# Patient Record
Sex: Female | Born: 1980 | Race: Black or African American | Hispanic: No | Marital: Married | Smoking: Never smoker
Health system: Southern US, Community
[De-identification: ages and names within clinical notes are randomized; demographics above are authoritative.]

## PROBLEM LIST (undated history)

## (undated) DIAGNOSIS — Z789 Other specified health status: Secondary | ICD-10-CM

## (undated) HISTORY — PX: NO PAST SURGERIES: SHX2092

## (undated) HISTORY — PX: WISDOM TOOTH EXTRACTION: SHX21

---
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
## 2002-07-03 ENCOUNTER — Emergency Department (HOSPITAL_COMMUNITY): Admission: EM | Admit: 2002-07-03 | Discharge: 2002-07-03 | Payer: Self-pay | Admitting: Emergency Medicine

## 2002-08-28 ENCOUNTER — Emergency Department (HOSPITAL_COMMUNITY): Admission: EM | Admit: 2002-08-28 | Discharge: 2002-08-28 | Payer: Self-pay | Admitting: *Deleted

## 2002-08-28 ENCOUNTER — Encounter: Payer: Self-pay | Admitting: *Deleted

## 2002-09-08 ENCOUNTER — Emergency Department (HOSPITAL_COMMUNITY): Admission: EM | Admit: 2002-09-08 | Discharge: 2002-09-08 | Payer: Self-pay | Admitting: *Deleted

## 2002-09-08 ENCOUNTER — Encounter: Payer: Self-pay | Admitting: *Deleted

## 2002-10-03 ENCOUNTER — Encounter: Payer: Self-pay | Admitting: Orthopaedic Surgery

## 2002-10-03 ENCOUNTER — Encounter (HOSPITAL_COMMUNITY): Admission: RE | Admit: 2002-10-03 | Discharge: 2002-11-02 | Payer: Self-pay | Admitting: Orthopaedic Surgery

## 2003-04-10 ENCOUNTER — Emergency Department (HOSPITAL_COMMUNITY): Admission: EM | Admit: 2003-04-10 | Discharge: 2003-04-11 | Payer: Self-pay | Admitting: Emergency Medicine

## 2003-06-01 ENCOUNTER — Emergency Department (HOSPITAL_COMMUNITY): Admission: EM | Admit: 2003-06-01 | Discharge: 2003-06-02 | Payer: Self-pay | Admitting: *Deleted

## 2003-06-01 ENCOUNTER — Emergency Department (HOSPITAL_COMMUNITY): Admission: EM | Admit: 2003-06-01 | Discharge: 2003-06-01 | Payer: Self-pay | Admitting: Emergency Medicine

## 2003-07-23 ENCOUNTER — Emergency Department (HOSPITAL_COMMUNITY): Admission: EM | Admit: 2003-07-23 | Discharge: 2003-07-23 | Payer: Self-pay | Admitting: Emergency Medicine

## 2003-07-23 ENCOUNTER — Encounter: Payer: Self-pay | Admitting: Emergency Medicine

## 2003-07-24 ENCOUNTER — Emergency Department (HOSPITAL_COMMUNITY): Admission: AD | Admit: 2003-07-24 | Discharge: 2003-07-24 | Payer: Self-pay | Admitting: Family Medicine

## 2003-07-24 ENCOUNTER — Encounter: Payer: Self-pay | Admitting: Family Medicine

## 2003-07-26 ENCOUNTER — Encounter: Admission: RE | Admit: 2003-07-26 | Discharge: 2003-08-20 | Payer: Self-pay | Admitting: Family Medicine

## 2003-12-02 ENCOUNTER — Emergency Department (HOSPITAL_COMMUNITY): Admission: AD | Admit: 2003-12-02 | Discharge: 2003-12-02 | Payer: Self-pay | Admitting: Family Medicine

## 2004-03-13 ENCOUNTER — Emergency Department (HOSPITAL_COMMUNITY): Admission: EM | Admit: 2004-03-13 | Discharge: 2004-03-13 | Payer: Self-pay | Admitting: Family Medicine

## 2004-03-16 ENCOUNTER — Emergency Department (HOSPITAL_COMMUNITY): Admission: EM | Admit: 2004-03-16 | Discharge: 2004-03-16 | Payer: Self-pay | Admitting: Emergency Medicine

## 2004-03-24 ENCOUNTER — Inpatient Hospital Stay (HOSPITAL_COMMUNITY): Admission: AD | Admit: 2004-03-24 | Discharge: 2004-03-24 | Payer: Self-pay | Admitting: Obstetrics & Gynecology

## 2004-05-17 ENCOUNTER — Inpatient Hospital Stay (HOSPITAL_COMMUNITY): Admission: AD | Admit: 2004-05-17 | Discharge: 2004-05-17 | Payer: Self-pay | Admitting: Obstetrics and Gynecology

## 2004-05-25 ENCOUNTER — Inpatient Hospital Stay (HOSPITAL_COMMUNITY): Admission: AD | Admit: 2004-05-25 | Discharge: 2004-05-26 | Payer: Self-pay | Admitting: Family Medicine

## 2004-08-21 ENCOUNTER — Other Ambulatory Visit: Admission: RE | Admit: 2004-08-21 | Discharge: 2004-08-21 | Payer: Self-pay | Admitting: Obstetrics and Gynecology

## 2004-10-17 ENCOUNTER — Inpatient Hospital Stay (HOSPITAL_COMMUNITY): Admission: AD | Admit: 2004-10-17 | Discharge: 2004-10-17 | Payer: Self-pay | Admitting: Obstetrics & Gynecology

## 2004-10-22 ENCOUNTER — Inpatient Hospital Stay (HOSPITAL_COMMUNITY): Admission: AD | Admit: 2004-10-22 | Discharge: 2004-10-22 | Payer: Self-pay | Admitting: Obstetrics and Gynecology

## 2004-10-25 ENCOUNTER — Inpatient Hospital Stay (HOSPITAL_COMMUNITY): Admission: AD | Admit: 2004-10-25 | Discharge: 2004-10-25 | Payer: Self-pay | Admitting: Obstetrics and Gynecology

## 2004-10-27 ENCOUNTER — Inpatient Hospital Stay (HOSPITAL_COMMUNITY): Admission: AD | Admit: 2004-10-27 | Discharge: 2004-10-27 | Payer: Self-pay | Admitting: Obstetrics and Gynecology

## 2004-10-31 ENCOUNTER — Inpatient Hospital Stay (HOSPITAL_COMMUNITY): Admission: AD | Admit: 2004-10-31 | Discharge: 2004-11-03 | Payer: Self-pay | Admitting: Obstetrics and Gynecology

## 2004-12-12 ENCOUNTER — Other Ambulatory Visit: Admission: RE | Admit: 2004-12-12 | Discharge: 2004-12-12 | Payer: Self-pay | Admitting: Obstetrics and Gynecology

## 2005-02-09 ENCOUNTER — Emergency Department (HOSPITAL_COMMUNITY): Admission: EM | Admit: 2005-02-09 | Discharge: 2005-02-09 | Payer: Self-pay | Admitting: Emergency Medicine

## 2005-05-21 ENCOUNTER — Other Ambulatory Visit: Admission: RE | Admit: 2005-05-21 | Discharge: 2005-05-21 | Payer: Self-pay | Admitting: Obstetrics and Gynecology

## 2005-06-26 ENCOUNTER — Other Ambulatory Visit: Admission: RE | Admit: 2005-06-26 | Discharge: 2005-06-26 | Payer: Self-pay | Admitting: Obstetrics and Gynecology

## 2005-10-28 ENCOUNTER — Other Ambulatory Visit: Admission: RE | Admit: 2005-10-28 | Discharge: 2005-10-28 | Payer: Self-pay | Admitting: Obstetrics and Gynecology

## 2006-01-16 ENCOUNTER — Inpatient Hospital Stay (HOSPITAL_COMMUNITY): Admission: AD | Admit: 2006-01-16 | Discharge: 2006-01-17 | Payer: Self-pay | Admitting: Obstetrics and Gynecology

## 2006-07-08 ENCOUNTER — Other Ambulatory Visit: Admission: RE | Admit: 2006-07-08 | Discharge: 2006-07-08 | Payer: Self-pay | Admitting: Obstetrics and Gynecology

## 2006-07-30 ENCOUNTER — Emergency Department (HOSPITAL_COMMUNITY): Admission: EM | Admit: 2006-07-30 | Discharge: 2006-07-30 | Payer: Self-pay | Admitting: Emergency Medicine

## 2007-09-07 ENCOUNTER — Inpatient Hospital Stay (HOSPITAL_COMMUNITY): Admission: AD | Admit: 2007-09-07 | Discharge: 2007-09-07 | Payer: Self-pay | Admitting: Obstetrics and Gynecology

## 2007-09-09 ENCOUNTER — Inpatient Hospital Stay (HOSPITAL_COMMUNITY): Admission: AD | Admit: 2007-09-09 | Discharge: 2007-09-09 | Payer: Self-pay | Admitting: Obstetrics & Gynecology

## 2007-09-15 ENCOUNTER — Inpatient Hospital Stay (HOSPITAL_COMMUNITY): Admission: RE | Admit: 2007-09-15 | Discharge: 2007-09-15 | Payer: Self-pay | Admitting: Obstetrics & Gynecology

## 2007-09-28 ENCOUNTER — Inpatient Hospital Stay (HOSPITAL_COMMUNITY): Admission: RE | Admit: 2007-09-28 | Discharge: 2007-09-28 | Payer: Self-pay | Admitting: Obstetrics and Gynecology

## 2008-04-22 ENCOUNTER — Inpatient Hospital Stay (HOSPITAL_COMMUNITY): Admission: AD | Admit: 2008-04-22 | Discharge: 2008-04-22 | Payer: Self-pay | Admitting: Obstetrics and Gynecology

## 2008-05-04 ENCOUNTER — Inpatient Hospital Stay (HOSPITAL_COMMUNITY): Admission: RE | Admit: 2008-05-04 | Discharge: 2008-05-06 | Payer: Self-pay | Admitting: Obstetrics and Gynecology

## 2009-02-14 ENCOUNTER — Encounter: Admission: RE | Admit: 2009-02-14 | Discharge: 2009-05-15 | Payer: Self-pay | Admitting: Family Medicine

## 2010-02-10 ENCOUNTER — Other Ambulatory Visit: Admission: RE | Admit: 2010-02-10 | Discharge: 2010-02-10 | Payer: Self-pay | Admitting: Family Medicine

## 2010-10-26 ENCOUNTER — Encounter: Payer: Self-pay | Admitting: Obstetrics & Gynecology

## 2011-02-17 NOTE — H&P (Signed)
NAME:  Natasha Bridges, Natasha Bridges               ACCOUNT NO.:  [id number]   MEDICAL RECORD NO.:  [id number]          PATIENT TYPE:  INP   LOCATION:  9167                          FACILITY:  [facility]   PHYSICIAN:  Naima A. Dillard, M.D. DATE OF BIRTH:  01/03/1981   DATE OF ADMISSION:  05/04/2008  DATE OF DISCHARGE:                              HISTORY & PHYSICAL   HISTORY OF PRESENT ILLNESS:  Ms. Natasha Bridges is a 30 year old married black  female, gravida 2, para 1-0-0-1 at 40-1/7 weeks, pertinent EDC of May 03, 2008, who presents for term induction of labor secondary to  favorable cervix.  The patient is without complains or pain this  morning.  She denies PIH or UTI signs or symptoms, nausea, vomiting,  diarrhea, shortness of breath, fever or cough.  She has been followed by  the M.D. service at [facility].  Dr. Normand Sloop is her primary physician.  Her  history of is remarkable for:  1. History of abnormal Pap.  2. History of PIH with her first pregnancy.  3. Anemia.  4. Group beta strep positive.  The patient does have 30 day bilateral      tubal ligation papers signed on her chart.  However, this morning,      she does not desire to proceed with the procedure, and plans IUD      for postpartum contraception.   PRENATAL LABORATORIES:  The patient's blood type is O positive, Rh  antibody screen negative, sickle cell negative.  Hemoglobin on January  14 was 12.4, platelets 276.  RPR nonreactive, rubella titer immune.  Hepatitis surface antigen negative.  HIV nonreactive.  Pap on January 14  was within normal limits.  Gonorrhea and chlamydia cultures were  negative.  Group beta strep is positive.  One hour GTT was within normal  limits and equal to 97.   OBSTETRICAL HISTORY:  Gravida 1 was spontaneous vaginal delivery January  2006, female infant weighing 7 pounds 10 ounces at [redacted] weeks gestation.  She had an induction of labor for PIH.  She labored for approximately 13  hours.  She did have an epidural, and  her daughter's name is [name].  No  complications.  Was delivered by Dr. Normand Sloop.  Rhett Bannister 2 is current  pregnancy and different father of the baby this pregnancy.   ALLERGIES:  She denies medication or latex allergies or other  sensitivities.   PAST MEDICAL HISTORY:  She reports menarche at 30 years of age.  She has  monthly cycles, five days in length without abnormalities.  LMP August 02, 2007.  EDC of May 03, 2008.  Ultrasound was showing five week  ultrasound showing EDC of May 08, 2008.  Pregnancy test September 05, 2007, she had ultrasounds October 19, 2007, and October 28, 2007.  Does  have a history of anemia.  PIH with her first pregnancy ensuing  induction.  In the past, she has used oral contraceptive pills for  contraception as well as condoms.  History of abnormal Pap.  She had  LGSIL April 01, 2004, as well as May 21, 2005.  ASCUS was high risk,  HPV October 28, 2005.  Had a repeat Pap July 08, 2006.  She had  colposcopy July 26, 2006, [facility]-I and then has since not followed up  until she had her Pap smear at her new OB visit.  She was treated for  Trichomas in 2005 at [facility].  She reports normal  childhood illnesses, occasional UTI.   PAST SURGICAL HISTORY:  Unremarkable.   GENETIC HISTORY:  Unremarkable.  Different father of the baby this  pregnancy.   FAMILY HISTORY:  Maternal grandmother heart disease.  Maternal  grandmother high blood pressure.  Mother depression and anxiety.  Maternal grandmother and her mother are diabetic.   SOCIAL HISTORY:  The patient is a married black female.  Husband's name  is Lashondra Vaquerano.  They are of [facility] faith.  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  Husband works full time at  [employer], 12 years of education.  Father of the  baby is involved and supportive.  Patient denied alcohol, tobacco or  illicit drug use.  The patient entered care January  14 for her new OB  workup.  Planned Dr. Normand Sloop to be her primary physician.  Prenatal  labs, Pap and cultures were obtained that day.  She had a Chem-9 with  positive nitrites and was prescribed Macrobid for seven days.  However,  culture came back resistant to Macrobid and then was prescribed Keflex  for 14 days.  She did desire first trimester screen which she had  January 23, and it was within normal limits.  There was some discretion  about EDC.  The patient had given originally an LMP of July 23.  However, again stated as October and then stated it was the 20th, which  gave an [facility] of May 01, 2008.  First positive pregnancy test was in  December.  She had an ultrasound at approximately five weeks, giving an  [facility] of May 11, 2008, then an ultrasound at 12 weeks, May 08, 2008,  then her anatomy ultrasound showed May 12, 2008.  She had AFP at 18-  4/7 weeks which was within normal limits.  An anatomy scan was  incomplete at that time, and then was repeated at her next visit.  Normal growth and development were noted.  Cervix 4.7 cm, anterior  placenta.  At around 21-6/7 weeks, was desiring BTL, and papers were  signed at that time.  Had some inner ear difficulties around 21-6/7  weeks and was prescribed Sudafed.  She had her one hour GTT at 25-6/7  weeks which was within normal limits, equal to 97.  Her hemoglobin was  10.4 at that time and was started on Ferralet 90 one daily.  Fetal  fibronectin was done at that time for increasing contractions which was  negative.  She was measuring at approximately 29-5/7 weeks less than  dates.  Ultrasound was then obtained at 31 weeks showing single  intrauterine pregnancy with breech presentation.  Cervix 4.8 cm, normal  fluid at 32-5/7 weeks, remained breech.  Pregnancy continued to progress  with any other complications until her presentation today for her  induction.   OBJECTIVE:  VITAL SIGNS:  On admission, blood pressure 114/68,  heart  rate 96, respirations 20, temperature 99.1.  EFM showing fetal heart  rate of 135, moderate variability, no decelerations and reactive.  Tocometry showing rare uterine contractions which are mild on palpation.  GENERAL:  No  acute distress.  Alert and oriented x3, pleasant.  HEENT:  Grossly intact and within normal limits.  CARDIOVASCULAR:  Regular rate and rhythm without murmur.  LUNGS:  Clear to auscultation bilaterally.  ABDOMEN:  Soft, nontender and gravid.  Ultrasound at bedside showing  vertex presentation.  Fetal position is more to mom's right side of  abdomen.  Cervix is 2+ cm, 80% and -2, very soft.  EXTREMITIES:  No edema.  DTRs 2+.  No clonus.   LABORATORY DATA:  Patient's WBCs are 12.5, hemoglobin 10.2, hematocrit  29.8, platelets 287.   IMPRESSION:  1. Intrauterine pregnancy at 40-1/7 weeks.  2. GBS positive.  3. Reassuring fetal heart tracing.  4. Stable mom.   PLAN:  1. Admit to birthing suites with Dr. Normand Sloop as attending physician.  2. Routine L&D orders.  3. Penicillin G IV per protocol.  4. GBS prophylaxis.  5. Low-dose pitocin IV per protocol for induction agent per Dr.      Normand Sloop and then Dr. Normand Sloop plans AROM for further augmentation.  6. MD to follow.      Candice [place], CNM      ______________________________  Pierre Bali Normand Sloop, M.D.    CHS/MEDQ  D:  05/04/2008  T:  05/04/2008  Job:  [id number]

## 2011-02-20 NOTE — H&P (Signed)
NAME:  Natasha Bridges, Natasha Bridges               ACCOUNT NO.:  [id number]   MEDICAL RECORD NO.:  [id number]          PATIENT TYPE:  INP   LOCATION:  9168                          [facility]ILITY:  [facility]   PHYSICIAN:  Naima A. Dillard, M.D. DATE OF BIRTH:  22-Nov-1980   DATE OF ADMISSION:  10/31/2004  DATE OF DISCHARGE:                                HISTORY & PHYSICAL   This is a 30 year old, gravida 1, para 0 at 38-6/7th weeks, who presents for  elective induction of labor secondary to pregnancy induced hypertension, per  Dr. Normand Sloop.  patient had elevated blood pressures in the office on several  occasions and her most recent tp on 24hr urine is 287mg .  She denies leaking  or bleeding.  Reports positive fetal movement.   The pregnancy has been followed by the M.D. service and remarkable for:  1.  First trimester trichomonas.  2.  History of LGSIL.  3.  Group B strep negative.   OBSTETRICAL HISTORY:  The patient is a primigravida.   MEDICAL HISTORY:  1.  Remarkable for abnormal Pap in 2004.  2.  History of Trichomonas, in June 2005, which was treated.  3.  Childhood Varicella.   FAMILY HISTORY:  Remarkable for a grandmother with heart disease.  Grandmother and mother with diabetes.  Grandmother with arthritis.   GENETIC HISTORY:  Unremarkable.   SOCIAL HISTORY:  The patient is single.  Father of the baby is involved and  supportive.  She is of the [employer].  She denies any alcohol, tobacco,  or drug use.   PRENATAL LABS:  Hemoglobin 12.8, platelets 317.  Blood type O positive.  Antibody screen negative.  Sickle cell negative.  RPR nonreactive.  Rubella  immune.  Hepatitis negative.  HIV negative.  Pap test low-grade SIL.  Gonorrhea negative.  Chlamydia negative.  Hemoglobin electrophoresis normal.  Group B strep negative.   HISTORY OF CURRENT PREGNANCY:  The patient entered care at 10 weeks'  gestation.  She was treated for trichomonas.  She had a normal ultrasound at  18 weeks and  another one at 20 weeks to complete the anatomy screen.  She  initially had low weight gain in the early part of pregnancy and had an  ultrasound at 28 weeks which showed 75-90 percentile growth.  Glucola was  within normal limits and the rest of her pregnancy was uneventful.   OBJECTIVE DATA:  VITAL SIGNS:  Stable.  Blood pressure 130/73.  CBC:  White  blood cell count 12.9, hemoglobin 9.5, platelets 332.  HEENT:  Within normal limits.  Thyroid normal, not enlarged.  CHEST:  Clear to auscultation.  HEART:  Regular rate and rhythm.  ABDOMEN:  Gravid at 38-cm, vertex, Leopold's.  EFM shows reactive fetal  heart movement.  Irregular contractions.  PELVIC:  The cervix is 2, 50%, and the vertex is high.  EXTREMITIES:  Show trace edema.   ASSESSMENT:  1.  Intrauterine pregnancy at 38-6.7ths weeks.  2.  Pregnancy induced hypertension.   PLAN:  1.  Admit to birthing suite, per Dr. Normand Sloop.  2.  Routine M.D. orders.  3.  Cytotec per protocol.  4.  PIH labs.      MLW/MEDQ  D:  11/01/2004  T:  11/01/2004  Job:  [id number]

## 2011-03-09 ENCOUNTER — Other Ambulatory Visit: Payer: Self-pay | Admitting: Family Medicine

## 2011-03-09 ENCOUNTER — Ambulatory Visit
Admission: RE | Admit: 2011-03-09 | Discharge: 2011-03-09 | Disposition: A | Discharge status: Home / Self Care | Payer: Managed Care, Other (non HMO) | Source: Ambulatory Visit | Attending: Family Medicine | Admitting: Family Medicine

## 2011-03-12 ENCOUNTER — Other Ambulatory Visit: Payer: Self-pay | Admitting: Family Medicine

## 2011-03-12 ENCOUNTER — Other Ambulatory Visit (HOSPITAL_COMMUNITY)
Admission: RE | Admit: 2011-03-12 | Discharge: 2011-03-12 | Disposition: A | Discharge status: Home / Self Care | Payer: Managed Care, Other (non HMO) | Source: Ambulatory Visit | Attending: Family Medicine | Admitting: Family Medicine

## 2011-03-12 DIAGNOSIS — Z01419 Encounter for gynecological examination (general) (routine) without abnormal findings: Secondary | ICD-10-CM | POA: Insufficient documentation

## 2011-07-03 LAB — CBC
Hemoglobin: 10.2 — ABNORMAL LOW
Hemoglobin: 8.6 — ABNORMAL LOW
MCV: 85.7
Platelets: 287
Platelets: 253
RBC: 2.97 — ABNORMAL LOW
RDW: 15.3
WBC: 12.5 — ABNORMAL HIGH

## 2011-07-03 LAB — RPR: RPR Ser Ql: NONREACTIVE

## 2011-07-10 LAB — URINE MICROSCOPIC-ADD ON

## 2011-07-10 LAB — URINALYSIS, ROUTINE W REFLEX MICROSCOPIC
Glucose, UA: NEGATIVE
Hgb urine dipstick: NEGATIVE
pH: 8

## 2011-07-13 LAB — WET PREP, GENITAL
Trich, Wet Prep: NONE SEEN
Yeast Wet Prep HPF POC: NONE SEEN

## 2011-07-13 LAB — URINALYSIS, ROUTINE W REFLEX MICROSCOPIC
Glucose, UA: NEGATIVE
Ketones, ur: 15 — AB
Nitrite: NEGATIVE
Protein, ur: 30 — AB
Specific Gravity, Urine: 1.025

## 2011-07-13 LAB — CBC
MCV: 85.4
Platelets: 306
WBC: 8.9

## 2011-07-13 LAB — GC/CHLAMYDIA PROBE AMP, GENITAL: Chlamydia, DNA Probe: NEGATIVE

## 2011-07-13 LAB — URINE MICROSCOPIC-ADD ON

## 2012-03-30 ENCOUNTER — Other Ambulatory Visit (HOSPITAL_COMMUNITY)
Admission: RE | Admit: 2012-03-30 | Discharge: 2012-03-30 | Disposition: A | Discharge status: Home / Self Care | Payer: Commercial Indemnity | Source: Ambulatory Visit | Attending: Family Medicine | Admitting: Family Medicine

## 2012-03-30 ENCOUNTER — Other Ambulatory Visit: Payer: Self-pay | Admitting: Family Medicine

## 2012-03-30 DIAGNOSIS — Z01419 Encounter for gynecological examination (general) (routine) without abnormal findings: Secondary | ICD-10-CM | POA: Insufficient documentation

## 2012-04-12 ENCOUNTER — Ambulatory Visit (INDEPENDENT_AMBULATORY_CARE_PROVIDER_SITE_OTHER): Payer: Commercial Indemnity | Admitting: Obstetrics and Gynecology

## 2012-04-12 ENCOUNTER — Encounter: Payer: Self-pay | Admitting: Obstetrics and Gynecology

## 2012-04-12 VITALS — BP 100/70 | HR 74 | Ht 64.0 in | Wt 172.0 lb

## 2012-04-12 DIAGNOSIS — O26899 Other specified pregnancy related conditions, unspecified trimester: Secondary | ICD-10-CM

## 2012-04-12 DIAGNOSIS — IMO0002 Reserved for concepts with insufficient information to code with codable children: Secondary | ICD-10-CM

## 2012-04-12 LAB — POCT URINE PREGNANCY: Preg Test, Ur: NEGATIVE

## 2012-04-12 NOTE — Progress Notes (Signed)
Patient seen by PCP for annual exam-June 26th and was told she had something abnormal on her cervix. Patient denies any vaginitis symptoms, pain with intercourse, change in bleeding pattern, pelvic pain or other complaints.   O: Pelvic:  EGBUS-wnl, vagina-normal, cervix- 1/3 of IUD protruding from os, removed without difficulty, uterus-appears normal size, no adnexal tenderness  UPT: negative   A: Partial IUD Expulsion  P: Patient want another Mirena IUD     IUD information form given, advised  to abstain or use condoms until IUD is inserted.     RTO-IUD insertion  Natasha Dolecki, PA-C

## 2012-04-20 ENCOUNTER — Encounter: Payer: Self-pay | Admitting: Obstetrics and Gynecology

## 2012-04-21 ENCOUNTER — Encounter: Payer: Self-pay | Admitting: Obstetrics and Gynecology

## 2012-04-21 ENCOUNTER — Ambulatory Visit (INDEPENDENT_AMBULATORY_CARE_PROVIDER_SITE_OTHER): Payer: Commercial Indemnity | Admitting: Obstetrics and Gynecology

## 2012-04-21 VITALS — BP 110/64 | Wt 173.0 lb

## 2012-04-21 DIAGNOSIS — Z30430 Encounter for insertion of intrauterine contraceptive device: Secondary | ICD-10-CM

## 2012-04-21 LAB — POCT URINE PREGNANCY: Preg Test, Ur: NEGATIVE

## 2012-04-21 MED ORDER — LEVONORGESTREL 20 MCG/24HR IU IUD
INTRAUTERINE_SYSTEM | Freq: Once | INTRAUTERINE | Status: AC
  Administered 2012-04-21: 1 via INTRAUTERINE

## 2012-04-21 NOTE — Addendum Note (Signed)
Addended byWinfred Leeds on: 04/21/2012 12:44 PM   Modules accepted: Orders

## 2012-04-21 NOTE — Patient Instructions (Addendum)
Keep follow up appointment in 4 weeks  Call [facility] [phone]:  -for temperature of 100.4 degrees Fahrenheit or more -pain not improved with over the counter pain medications (Ibuprofen, Advil, Aleve,        Tylenol or acetaminophen) -for excessive bleeding (more than a usual period) -for any other concerns  Do not place anything in your vagina for the next 7 days   

## 2012-04-21 NOTE — Addendum Note (Signed)
Addended byWinfred Leeds on: 04/21/2012 12:47 PM   Modules accepted: Orders

## 2012-04-21 NOTE — Progress Notes (Signed)
IUD INSERTION NOTE  Natasha Bridges is a 31 y.o. female G2P2002 who presents for IUD insertion.  Consent signed after risks and benefits were reviewed including but not limited to bleeding, infection, expulsion and risk of uterine perforation that may require an additional procedure for removal.  LMP: Patient's last menstrual period was 04/15/2012. UPT: negative  MIRENA LOT NUMBER: [id number]   Prepped with Betadine  Tenaculum placed on anterior lip of cervix after Hurricane gel was applied Uterus sounded at  7.5  cm Insertion of MIRENA IUD per protocol without any complications String trimmed to 2 cm   Assessment:  IUD Insertion  Plan:  1. Patient instructed to call with oral temperature of 100.4 degrees Fahrenheit or more, excessive bleeding or pain that is not relieved with OTC analgesia taken as directed  2. Patient instructed on how  to check IUD strings and encouraged to do so after each menstrual cycle  3. Advised not to place anything in vagina or have sexual intercourse for 7 days  4. Follow-up:  4  weeks   Natasha Filice PA-C 04/21/2012 12:25 PM

## 2012-04-21 NOTE — Progress Notes (Signed)
IUD reinsertion

## 2012-05-30 ENCOUNTER — Encounter: Payer: Commercial Indemnity | Admitting: Obstetrics and Gynecology

## 2012-05-31 ENCOUNTER — Ambulatory Visit (INDEPENDENT_AMBULATORY_CARE_PROVIDER_SITE_OTHER): Payer: Commercial Indemnity | Admitting: Obstetrics and Gynecology

## 2012-05-31 ENCOUNTER — Encounter: Payer: Self-pay | Admitting: Obstetrics and Gynecology

## 2012-05-31 VITALS — BP 114/72 | Wt 175.0 lb

## 2012-05-31 DIAGNOSIS — Z30431 Encounter for routine checking of intrauterine contraceptive device: Secondary | ICD-10-CM

## 2012-05-31 NOTE — Addendum Note (Signed)
Addended byWinfred Leeds on: 05/31/2012 10:13 AM   Modules accepted: Orders

## 2012-05-31 NOTE — Progress Notes (Signed)
31 YO with Mirena IUD has no complaints.  Denies pelvic pain or irregular bleeding.  O: Abdomen: soft, non-tender      Pelvic: EGBUS-wnl, vagina-brown discharge, cervix-strings visible, uterus-normal size without tenderness, adnexae-no tenderness or masses  UPT: negative  A: IUD Check Up  P: RTO- as scheduled  Leslie Langille, PA-C

## 2014-08-06 ENCOUNTER — Encounter: Payer: Self-pay | Admitting: Obstetrics and Gynecology

## 2015-03-12 ENCOUNTER — Other Ambulatory Visit: Payer: Self-pay | Admitting: Family Medicine

## 2015-03-12 ENCOUNTER — Ambulatory Visit
Admission: RE | Admit: 2015-03-12 | Discharge: 2015-03-12 | Disposition: A | Discharge status: Home / Self Care | Payer: Commercial Indemnity | Source: Ambulatory Visit | Attending: Family Medicine | Admitting: Family Medicine

## 2015-03-12 DIAGNOSIS — R634 Abnormal weight loss: Secondary | ICD-10-CM

## 2017-02-21 IMAGING — CR DG CHEST 2V
2 series · 2 of 2 positions shown · non-contrast
Comparison: None.

CLINICAL DATA: Recent unexplained weight loss

EXAM:
CHEST  2 VIEW

[w chest pa]
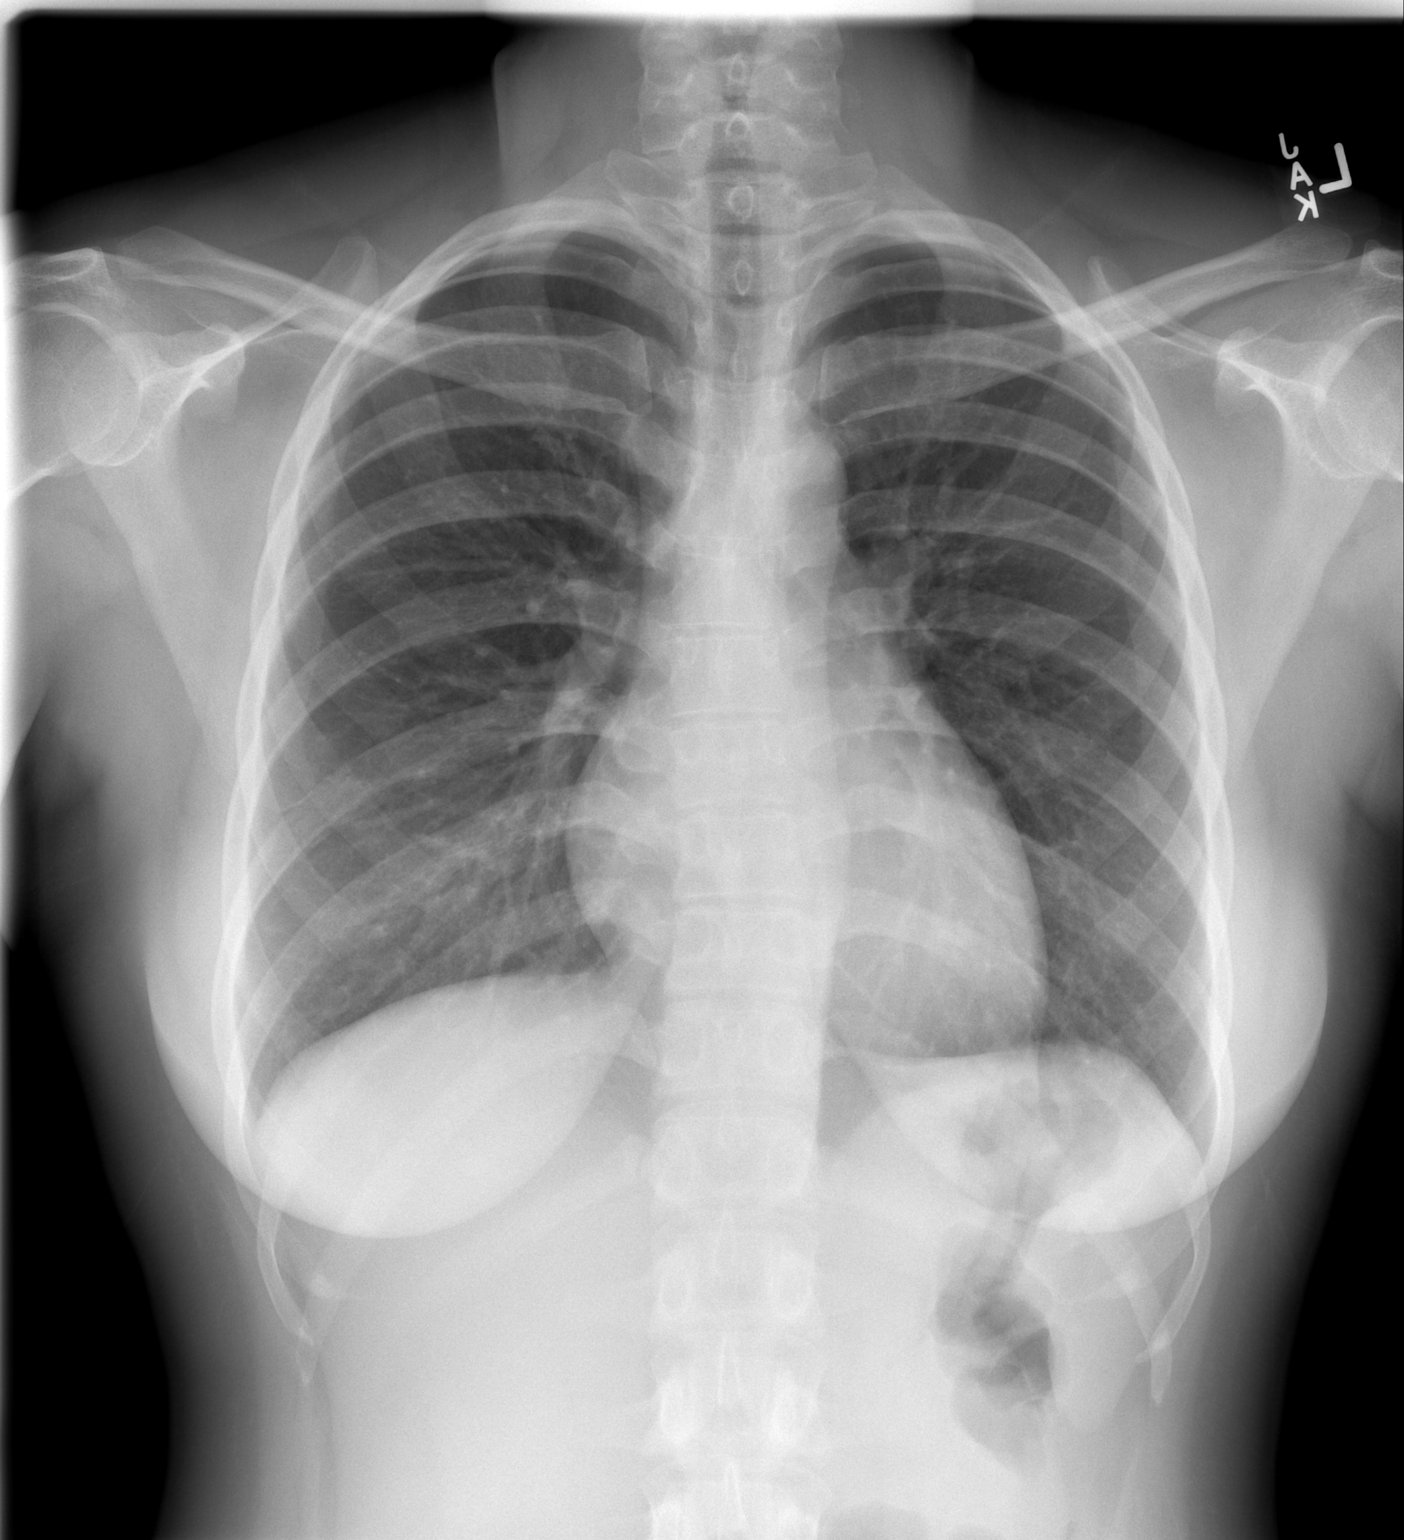

[w chest lat]
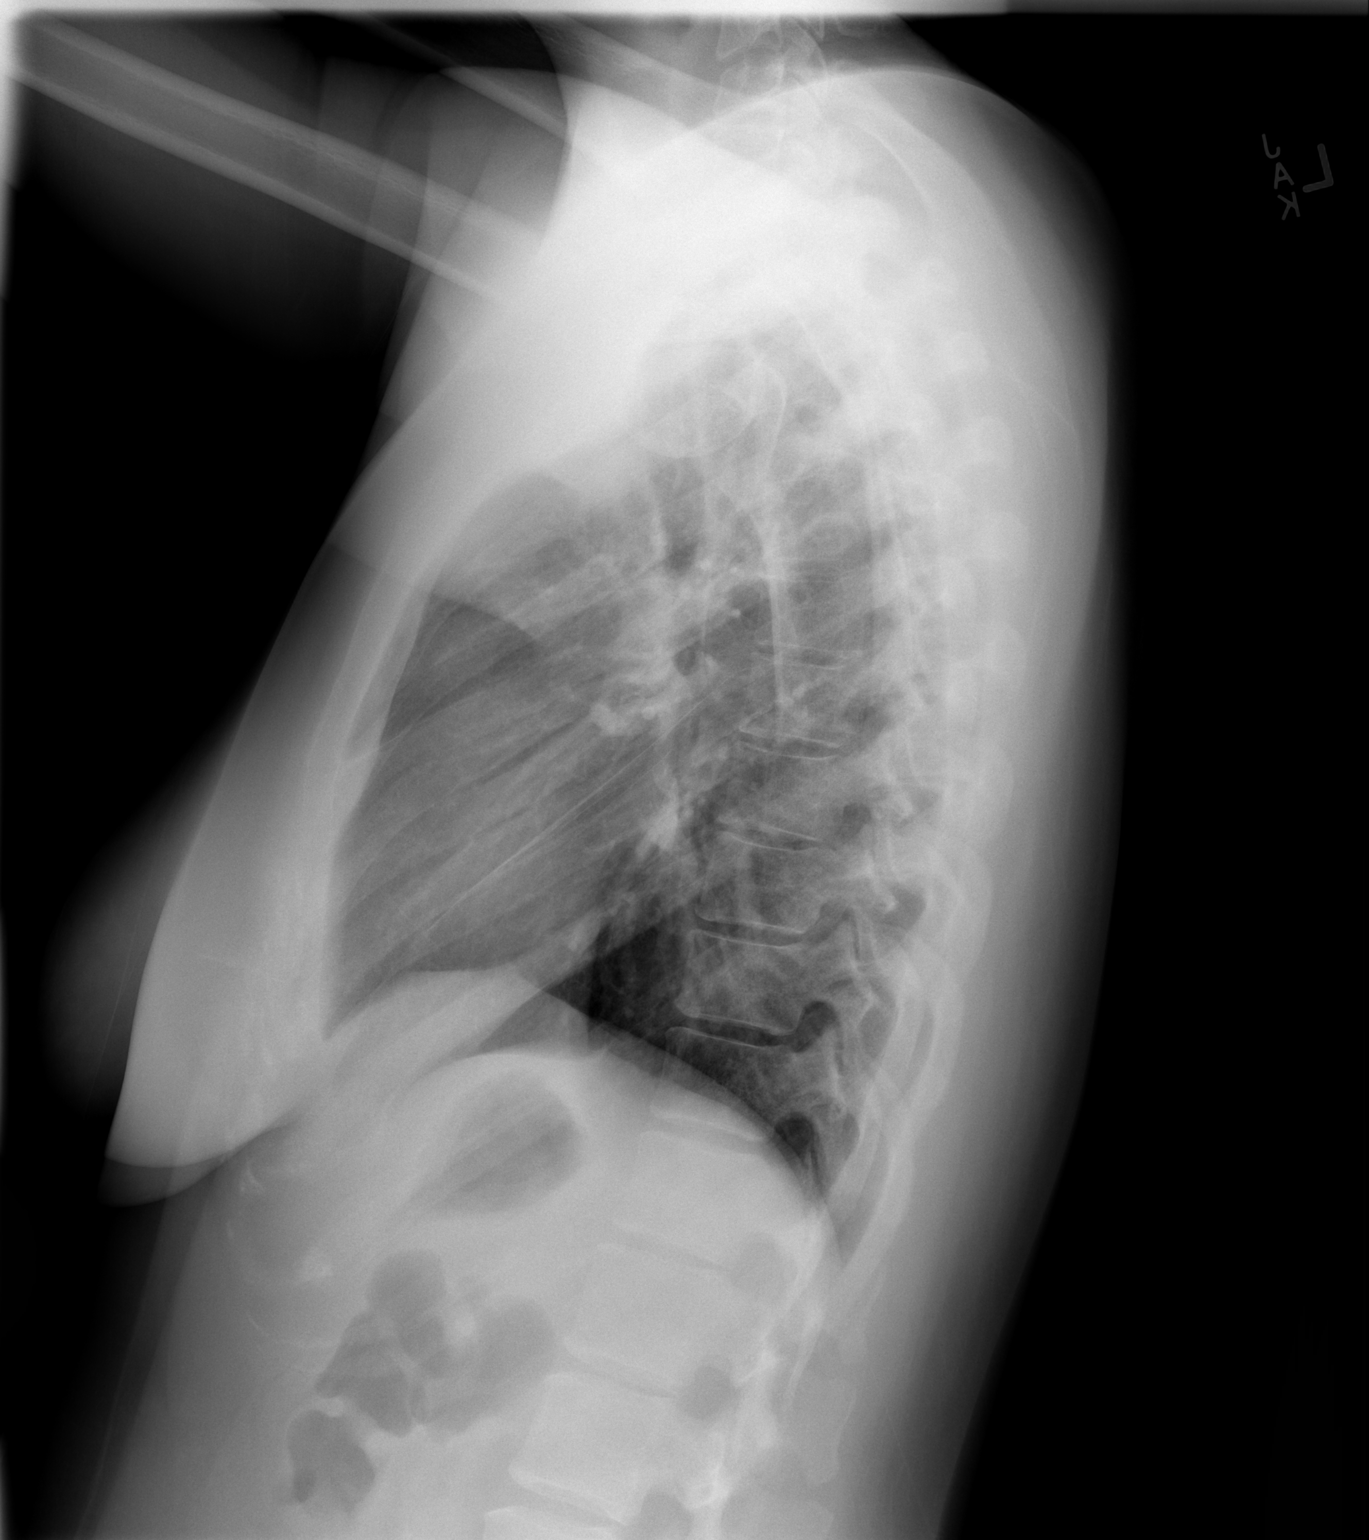

[2 of 2 positions shown; findings below may reference images not displayed]

FINDINGS: No active infiltrate or effusion is seen. Mediastinal and hilar
contours are unremarkable. The heart is within normal limits in
size. No bony abnormality is noted.
IMPRESSION: No active cardiopulmonary disease.

## 2019-09-15 ENCOUNTER — Other Ambulatory Visit: Payer: Self-pay

## 2019-09-15 DIAGNOSIS — Z20822 Contact with and (suspected) exposure to covid-19: Secondary | ICD-10-CM

## 2019-09-17 LAB — NOVEL CORONAVIRUS, NAA: SARS-CoV-2, NAA: NOT DETECTED

## 2019-11-21 ENCOUNTER — Encounter: Payer: Self-pay | Admitting: Obstetrics and Gynecology

## 2019-11-28 ENCOUNTER — Ambulatory Visit: Payer: Managed Care, Other (non HMO) | Attending: Internal Medicine

## 2019-11-28 DIAGNOSIS — Z20822 Contact with and (suspected) exposure to covid-19: Secondary | ICD-10-CM | POA: Insufficient documentation

## 2019-11-29 LAB — NOVEL CORONAVIRUS, NAA: SARS-CoV-2, NAA: NOT DETECTED

## 2020-07-10 ENCOUNTER — Other Ambulatory Visit: Payer: Self-pay

## 2022-06-17 ENCOUNTER — Ambulatory Visit
Admission: RE | Admit: 2022-06-17 | Discharge: 2022-06-17 | Disposition: A | Discharge status: Home / Self Care | Payer: Managed Care, Other (non HMO) | Source: Ambulatory Visit | Attending: Emergency Medicine | Admitting: Emergency Medicine

## 2022-06-17 VITALS — BP 113/77 | HR 99 | Temp 98.1°F | Resp 18

## 2022-06-17 DIAGNOSIS — J029 Acute pharyngitis, unspecified: Secondary | ICD-10-CM

## 2022-06-17 LAB — POCT RAPID STREP A (OFFICE): Rapid Strep A Screen: NEGATIVE

## 2022-06-17 NOTE — Discharge Instructions (Addendum)
The strep test is negative.    Take Tylenol or ibuprofen as needed for fever or discomfort.    Follow up with your primary care provider if your symptoms are not improving.    

## 2022-06-17 NOTE — ED Triage Notes (Signed)
Pt. Sates that she has been experiencing a sore throat since Saturday.

## 2022-06-17 NOTE — ED Provider Notes (Signed)
Renaldo Fiddler    CSN: [id number] Arrival date & time: 06/17/22  1531      History   Chief Complaint Chief Complaint  Patient presents with   Sore Throat    It has been like this since Saturday but not as bad thick thrush on the tongue. No other symptoms. - Entered by patient   Appointment    HPI Natasha Bridges is a 41 y.o. female.  Patient presents with 4-day history of sore throat.  No fever, chills, rash, cough, shortness of breath, vomiting, diarrhea, or other symptoms.  No OTC medications taken today.  No pertinent medical history.  The history is provided by the patient and medical records.    History reviewed. No pertinent past medical history.  There are no problems to display for this patient.   Past Surgical History:  Procedure Laterality Date   WISDOM TOOTH EXTRACTION      OB History     Gravida  2   Para  2   Term  2   Preterm      AB      Living  2      SAB      IAB      Ectopic      Multiple      Live Births           Obstetric Comments  PT HAS 2 FOSTER KIDS.          Home Medications    Prior to Admission medications   Medication Sig Start Date End Date Taking? Authorizing Provider  ciprofloxacin (CIPRO) 250 MG tablet Take 250 mg by mouth 2 (two) times daily.    [provider]  levonorgestrel (MIRENA) 20 MCG/24HR IUD 1 each by Intrauterine route once.    [provider]    Family History Family History  Problem Relation Age of Onset   Hypertension Mother     Social History Social History   Tobacco Use   Smoking status: Never   Smokeless tobacco: Never  Substance Use Topics   Alcohol use: No   Drug use: No     Allergies   Flagyl [metronidazole]   Review of Systems Review of Systems  Constitutional:  Negative for chills and fever.  HENT:  Positive for sore throat. Negative for ear pain.   Respiratory:  Negative for cough and shortness of breath.   Gastrointestinal:  Negative  for diarrhea and vomiting.  Skin:  Negative for color change and rash.  All other systems reviewed and are negative.    Physical Exam Triage Vital Signs ED Triage Vitals  Enc Vitals Group     BP 06/17/22 1600 113/77     Pulse Rate 06/17/22 1600 99     Resp 06/17/22 1600 18     Temp 06/17/22 1600 98.1 F (36.7 C)     Temp src --      SpO2 06/17/22 1600 96 %     Weight --      Height --      Head Circumference --      Peak Flow --      Pain Score 06/17/22 1556 3     Pain Loc --      Pain Edu? --      Excl. in GC? --    No data found.  Updated Vital Signs BP 113/77   Pulse 99   Temp 98.1 F (36.7 C)   Resp 18   LMP  (  LMP Unknown)   SpO2 96%   Visual Acuity Right Eye Distance:   Left Eye Distance:   Bilateral Distance:    Right Eye Near:   Left Eye Near:    Bilateral Near:     Physical Exam Vitals and nursing note reviewed.  Constitutional:      General: She is not in acute distress.    Appearance: Normal appearance. She is well-developed. She is not ill-appearing.  HENT:     Right Ear: Tympanic membrane normal.     Left Ear: Tympanic membrane normal.     Nose: Nose normal.     Mouth/Throat:     Mouth: Mucous membranes are moist.     Pharynx: Posterior oropharyngeal erythema present.  Cardiovascular:     Rate and Rhythm: Normal rate and regular rhythm.     Heart sounds: Normal heart sounds.  Pulmonary:     Effort: Pulmonary effort is normal. No respiratory distress.     Breath sounds: Normal breath sounds.  Musculoskeletal:     Cervical back: Neck supple.  Skin:    General: Skin is warm and dry.  Neurological:     Mental Status: She is alert.  Psychiatric:        Mood and Affect: Mood normal.        Behavior: Behavior normal.      UC Treatments / Results  Labs (all labs ordered are listed, but only abnormal results are displayed) Labs Reviewed  POCT RAPID STREP A (OFFICE)    EKG   Radiology No results found.  Procedures Procedures  (including critical care time)  Medications Ordered in UC Medications - No data to display  Initial Impression / Assessment and Plan / UC Course  I have reviewed the triage vital signs and the nursing notes.  Pertinent labs & imaging results that were available during my care of the patient were reviewed by me and considered in my medical decision making (see chart for details).    Sore throat.  Rapid strep negative.  Discussed symptomatic treatment including Tylenol or ibuprofen as needed.  Education provided on sore throat.  Instructed patient to follow up with her PCP if her symptoms are not improving.  She agrees to plan of care.    Final Clinical Impressions(s) / UC Diagnoses   Final diagnoses:  Sore throat     Discharge Instructions      The strep test is negative.    Take Tylenol or ibuprofen as needed for fever or discomfort.  Follow up with your primary care provider if your symptoms are not improving.        ED Prescriptions   None    PDMP not reviewed this encounter.   Mickie Bail, NP 06/17/22 [phone]

## 2022-06-18 ENCOUNTER — Emergency Department: Payer: Managed Care, Other (non HMO)

## 2022-06-18 ENCOUNTER — Observation Stay
Admission: EM | Admit: 2022-06-18 | Discharge: 2022-06-19 | Disposition: A | Discharge status: Home / Self Care | Payer: Managed Care, Other (non HMO) | Attending: Internal Medicine | Admitting: Internal Medicine

## 2022-06-18 ENCOUNTER — Other Ambulatory Visit: Payer: Self-pay

## 2022-06-18 DIAGNOSIS — E876 Hypokalemia: Secondary | ICD-10-CM | POA: Diagnosis present

## 2022-06-18 DIAGNOSIS — J020 Streptococcal pharyngitis: Principal | ICD-10-CM | POA: Insufficient documentation

## 2022-06-18 DIAGNOSIS — A419 Sepsis, unspecified organism: Secondary | ICD-10-CM | POA: Diagnosis present

## 2022-06-18 DIAGNOSIS — J0300 Acute streptococcal tonsillitis, unspecified: Secondary | ICD-10-CM | POA: Diagnosis not present

## 2022-06-18 DIAGNOSIS — R070 Pain in throat: Secondary | ICD-10-CM | POA: Diagnosis present

## 2022-06-18 HISTORY — DX: Other specified health status: Z78.9

## 2022-06-18 LAB — CBC WITH DIFFERENTIAL/PLATELET
Abs Immature Granulocytes: 0.15 10*3/uL — ABNORMAL HIGH (ref 0.00–0.07)
Basophils Absolute: 0.1 10*3/uL (ref 0.0–0.1)
Basophils Relative: 0 %
Eosinophils Absolute: 0 10*3/uL (ref 0.0–0.5)
Eosinophils Relative: 0 %
HCT: 33.5 % — ABNORMAL LOW (ref 36.0–46.0)
Hemoglobin: 11.2 g/dL — ABNORMAL LOW (ref 12.0–15.0)
Immature Granulocytes: 1 %
Lymphocytes Relative: 10 %
Lymphs Abs: 2.1 10*3/uL (ref 0.7–4.0)
MCH: 28.6 pg (ref 26.0–34.0)
MCHC: 33.4 g/dL (ref 30.0–36.0)
MCV: 85.5 fL (ref 80.0–100.0)
Monocytes Absolute: 2 10*3/uL — ABNORMAL HIGH (ref 0.1–1.0)
Monocytes Relative: 9 %
Neutro Abs: 17.6 10*3/uL — ABNORMAL HIGH (ref 1.7–7.7)
Neutrophils Relative %: 80 %
Platelets: 343 10*3/uL (ref 150–400)
RBC: 3.92 MIL/uL (ref 3.87–5.11)
RDW: 12.7 % (ref 11.5–15.5)
WBC: 21.9 10*3/uL — ABNORMAL HIGH (ref 4.0–10.5)
nRBC: 0 % (ref 0.0–0.2)

## 2022-06-18 LAB — BASIC METABOLIC PANEL
Anion gap: 12 (ref 5–15)
BUN: 11 mg/dL (ref 6–20)
CO2: 26 mmol/L (ref 22–32)
Calcium: 8.8 mg/dL — ABNORMAL LOW (ref 8.9–10.3)
Chloride: 99 mmol/L (ref 98–111)
Creatinine, Ser: 0.77 mg/dL (ref 0.44–1.00)
GFR, Estimated: >60 mL/min (ref >60)
Glucose, Bld: 118 mg/dL — ABNORMAL HIGH (ref 70–99)
Potassium: 2.7 mmol/L — CL (ref 3.5–5.1)
Sodium: 137 mmol/L (ref 135–145)

## 2022-06-18 LAB — GROUP A STREP BY PCR: Group A Strep by PCR: DETECTED — AB

## 2022-06-18 LAB — MONONUCLEOSIS SCREEN: Mono Screen: NEGATIVE

## 2022-06-18 MED ORDER — SODIUM CHLORIDE 0.9 % IV BOLUS
1000.0000 mL | Freq: Once | INTRAVENOUS | Status: AC
  Administered 2022-06-18: 1000 mL via INTRAVENOUS

## 2022-06-18 MED ORDER — SODIUM CHLORIDE 0.9 % IV SOLN
3.0000 g | Freq: Four times a day (QID) | INTRAVENOUS | Status: DC
  Administered 2022-06-19 (×2): 3 g via INTRAVENOUS
  Filled 2022-06-18 (×2): qty 3
  Filled 2022-06-18: qty 8

## 2022-06-18 MED ORDER — SODIUM CHLORIDE 0.9% FLUSH
3.0000 mL | Freq: Two times a day (BID) | INTRAVENOUS | Status: DC
  Administered 2022-06-19 (×2): 3 mL via INTRAVENOUS

## 2022-06-18 MED ORDER — DEXAMETHASONE SODIUM PHOSPHATE 4 MG/ML IJ SOLN
4.0000 mg | Freq: Two times a day (BID) | INTRAMUSCULAR | Status: DC
  Administered 2022-06-19: 4 mg via INTRAVENOUS
  Filled 2022-06-18: qty 1

## 2022-06-18 MED ORDER — MORPHINE SULFATE (PF) 2 MG/ML IV SOLN: 2.0000 mg | INTRAVENOUS | Status: DC | PRN

## 2022-06-18 MED ORDER — CEFTRIAXONE SODIUM 1 G IJ SOLR: 1.0000 g | Freq: Once | INTRAMUSCULAR | Status: DC

## 2022-06-18 MED ORDER — DEXAMETHASONE 10 MG/ML FOR PEDIATRIC ORAL USE: 10.0000 mg | Freq: Once | INTRAMUSCULAR | Status: DC

## 2022-06-18 MED ORDER — ACETAMINOPHEN 325 MG PO TABS: 650.0000 mg | ORAL_TABLET | Freq: Four times a day (QID) | ORAL | Status: DC | PRN

## 2022-06-18 MED ORDER — ACETAMINOPHEN 650 MG RE SUPP: 650.0000 mg | Freq: Four times a day (QID) | RECTAL | Status: DC | PRN

## 2022-06-18 MED ORDER — LIDOCAINE HCL (PF) 1 % IJ SOLN: 5.0000 mL | Freq: Once | INTRAMUSCULAR | Status: DC

## 2022-06-18 MED ORDER — SODIUM CHLORIDE 0.9 % IV SOLN: INTRAVENOUS | Status: DC

## 2022-06-18 MED ORDER — IOHEXOL 300 MG/ML  SOLN
75.0000 mL | Freq: Once | INTRAMUSCULAR | Status: AC | PRN
  Administered 2022-06-18: 75 mL via INTRAVENOUS

## 2022-06-18 MED ORDER — SODIUM CHLORIDE 0.9 % IV SOLN
3.0000 g | Freq: Once | INTRAVENOUS | Status: AC
  Administered 2022-06-18: 3 g via INTRAVENOUS
  Filled 2022-06-18: qty 8

## 2022-06-18 MED ORDER — ACETAMINOPHEN 160 MG/5ML PO SOLN
650.0000 mg | Freq: Once | ORAL | Status: AC
  Administered 2022-06-18: 650 mg via ORAL
  Filled 2022-06-18 (×2): qty 20.3

## 2022-06-18 MED ORDER — POTASSIUM CHLORIDE CRYS ER 20 MEQ PO TBCR
40.0000 meq | EXTENDED_RELEASE_TABLET | Freq: Once | ORAL | Status: AC
  Administered 2022-06-18: 40 meq via ORAL
  Filled 2022-06-18: qty 2

## 2022-06-18 MED ORDER — DEXAMETHASONE SODIUM PHOSPHATE 10 MG/ML IJ SOLN
10.0000 mg | Freq: Once | INTRAMUSCULAR | Status: AC
  Administered 2022-06-18: 10 mg via INTRAVENOUS
  Filled 2022-06-18: qty 1

## 2022-06-18 NOTE — ED Provider Notes (Signed)
[facility] Provider Note    Event Date/Time   First MD Initiated Contact with Patient 06/18/22 1900     (approximate)   History   Oral Swelling   HPI  Natasha Bridges is a 41 y.o. female presents emergency department complaining of a sore throat, fever, difficulty swallowing for 2 to 3 days.  Was seen at urgent care and they did a strep test yesterday which was negative.  Patient is worsened overnight.  States it hurts to swallow.  Is spitting in a cup but states she is able to swallow some of her spit.  No chest pain or shortness of breath.  Patient states she has not been drooling      Physical Exam   Triage Vital Signs: ED Triage Vitals [06/18/22 1826]  Enc Vitals Group     BP 135/73     Pulse Rate (!) 114     Resp 18     Temp (!) 100.4 F (38 C)     Temp src      SpO2 99 %     Weight 172 lb (78 kg)     Height      Head Circumference      Peak Flow      Pain Score 0     Pain Loc      Pain Edu?      Excl. in GC?     Most recent vital signs: Vitals:   06/18/22 1826  BP: 135/73  Pulse: (!) 114  Resp: 18  Temp: (!) 100.4 F (38 C)  SpO2: 99%     General: Awake, no distress.   CV:  Good peripheral perfusion. regular rate and  rhythm Resp:  Normal effort. Lungs CTA Abd:  No distention.   Other:  Throat is bright red and swollen, patient does not have a muffled voice, she does have a small amount of trismus, neck is supple, no lymphadenopathy   ED Results / Procedures / Treatments   Labs (all labs ordered are listed, but only abnormal results are displayed) Labs Reviewed  GROUP A STREP BY PCR - Abnormal; Notable for the following components:      Result Value   Group A Strep by PCR DETECTED (*)    All other components within normal limits  BASIC METABOLIC PANEL  CBC WITH DIFFERENTIAL/PLATELET  MONONUCLEOSIS SCREEN  POC URINE PREG, ED     EKG     RADIOLOGY     PROCEDURES:   Procedures   MEDICATIONS ORDERED  IN ED: Medications  acetaminophen (TYLENOL) 160 MG/5ML solution 650 mg (has no administration in time range)  Ampicillin-Sulbactam (UNASYN) 3 g in sodium chloride 0.9 % 100 mL IVPB (has no administration in time range)  dexamethasone (DECADRON) injection 10 mg (has no administration in time range)  sodium chloride 0.9 % bolus 1,000 mL (has no administration in time range)     IMPRESSION / MDM / ASSESSMENT AND PLAN / ED COURSE  I reviewed the triage vital signs and the nursing notes.                              Differential diagnosis includes, but is not limited to, strep throat, mono, PTA  Patient's presentation is most consistent with acute complicated illness / injury requiring diagnostic workup.   Patient strep test is positive  CBC, metabolic panel, monotest also ordered, patient will be given normal saline  1 L IV, Decadron 10 mg IV and Unasyn 3 g IV  CT of the soft tissue of the neck to assess for peritonsillar abscess due to the patient not swallowing well and having some trismus  Care transferred to Dr. Lenard Lance      FINAL CLINICAL IMPRESSION(S) / ED DIAGNOSES   Final diagnoses:  Acute streptococcal pharyngitis     Rx / DC Orders   ED Discharge Orders     None        Note:  This document was prepared using Dragon voice recognition software and may include unintentional dictation errors.    Faythe Ghee, PA-C 06/18/22 1916    Minna Antis, MD 06/18/22 2240

## 2022-06-18 NOTE — ED Triage Notes (Addendum)
Pt coming in pov with swelling  in throat with minimal sore throat that started Saturday. Pt has been unable to swallow and water of food today.   Pt has taken tylenol yesterday- with slight relief. Pt denies pain but says it feels swollen. Pt was seen in urgent care yesterday and was negative for strep.  Pt neck and throat appear very swollen. Pt denies any illness or related symptoms. Pt voice muffled and unable to open mouth wide for visualization.

## 2022-06-18 NOTE — ED Notes (Signed)
Multiple unsuccessful attempts to start IV.

## 2022-06-18 NOTE — ED Provider Notes (Signed)
-----------------------------------------   10:36 PM on 06/18/2022 ----------------------------------------- Patient care assumed from Greig Right.  Patient's labs have resulted showing significant leukocytosis of 21,000, chemistry shows hypokalemia at 2.7 mono is negative and strep is positive.  Patient CT scan has resulted showing significant tonsillopharyngitis as well as a retropharyngeal effusion.  Patient has a fever to 100.4.  I spoke to Dr. Andee Poles of ENT who recommends admission.  Patient has already received IV Unasyn as well as IV Decadron.  We will admit to the hospitalist for further work-up and treatment.  Patient reluctantly agreeable to plan.   Minna Antis, MD 06/18/22 2237

## 2022-06-19 ENCOUNTER — Other Ambulatory Visit: Payer: Self-pay

## 2022-06-19 DIAGNOSIS — A419 Sepsis, unspecified organism: Secondary | ICD-10-CM | POA: Diagnosis present

## 2022-06-19 DIAGNOSIS — J0300 Acute streptococcal tonsillitis, unspecified: Secondary | ICD-10-CM

## 2022-06-19 DIAGNOSIS — E876 Hypokalemia: Secondary | ICD-10-CM | POA: Diagnosis present

## 2022-06-19 LAB — COMPREHENSIVE METABOLIC PANEL
ALT: 29 U/L (ref 0–44)
AST: 19 U/L (ref 15–41)
Albumin: 3.6 g/dL (ref 3.5–5.0)
Alkaline Phosphatase: 69 U/L (ref 38–126)
Anion gap: 10 (ref 5–15)
BUN: 10 mg/dL (ref 6–20)
CO2: 23 mmol/L (ref 22–32)
Calcium: 8.5 mg/dL — ABNORMAL LOW (ref 8.9–10.3)
Chloride: 104 mmol/L (ref 98–111)
Creatinine, Ser: 0.65 mg/dL (ref 0.44–1.00)
GFR, Estimated: >60 mL/min (ref >60)
Glucose, Bld: 143 mg/dL — ABNORMAL HIGH (ref 70–99)
Potassium: 3.7 mmol/L (ref 3.5–5.1)
Sodium: 137 mmol/L (ref 135–145)
Total Bilirubin: 1.8 mg/dL — ABNORMAL HIGH (ref 0.3–1.2)
Total Protein: 8.4 g/dL — ABNORMAL HIGH (ref 6.5–8.1)

## 2022-06-19 LAB — HIV ANTIBODY (ROUTINE TESTING W REFLEX): HIV Screen 4th Generation wRfx: NONREACTIVE

## 2022-06-19 LAB — CBC
HCT: 33.2 % — ABNORMAL LOW (ref 36.0–46.0)
Hemoglobin: 11.1 g/dL — ABNORMAL LOW (ref 12.0–15.0)
MCH: 28.6 pg (ref 26.0–34.0)
MCHC: 33.4 g/dL (ref 30.0–36.0)
MCV: 85.6 fL (ref 80.0–100.0)
Platelets: 319 10*3/uL (ref 150–400)
RBC: 3.88 MIL/uL (ref 3.87–5.11)
RDW: 12.8 % (ref 11.5–15.5)
WBC: 23.9 10*3/uL — ABNORMAL HIGH (ref 4.0–10.5)
nRBC: 0 % (ref 0.0–0.2)

## 2022-06-19 MED ORDER — PREDNISONE 50 MG PO TABS: 50.0000 mg | ORAL_TABLET | Freq: Every day | ORAL | Status: DC

## 2022-06-19 MED ORDER — PREDNISONE 10 MG PO TABS: 10.0000 mg | ORAL_TABLET | Freq: Every day | ORAL | Status: DC

## 2022-06-19 MED ORDER — PREDNISONE 10 MG (21) PO TBPK: 10.0000 mg | ORAL_TABLET | ORAL | Status: DC

## 2022-06-19 MED ORDER — PREDNISONE 10 MG PO TABS: ORAL_TABLET | ORAL | 0 refills | Status: AC

## 2022-06-19 MED ORDER — PREDNISONE 20 MG PO TABS: 30.0000 mg | ORAL_TABLET | Freq: Every day | ORAL | Status: DC

## 2022-06-19 MED ORDER — PREDNISONE 10 MG (21) PO TBPK: 20.0000 mg | ORAL_TABLET | Freq: Every morning | ORAL | Status: DC

## 2022-06-19 MED ORDER — PREDNISONE 50 MG PO TABS: 60.0000 mg | ORAL_TABLET | Freq: Every day | ORAL | Status: DC

## 2022-06-19 MED ORDER — AMOXICILLIN-POT CLAVULANATE 875-125 MG PO TABS: 1.0000 | ORAL_TABLET | Freq: Two times a day (BID) | ORAL | Status: DC

## 2022-06-19 MED ORDER — PREDNISONE 20 MG PO TABS: 20.0000 mg | ORAL_TABLET | Freq: Every day | ORAL | Status: DC

## 2022-06-19 MED ORDER — PREDNISONE 10 MG (21) PO TBPK: 10.0000 mg | ORAL_TABLET | Freq: Four times a day (QID) | ORAL | Status: DC

## 2022-06-19 MED ORDER — AMOXICILLIN-POT CLAVULANATE 875-125 MG PO TABS: 1.0000 | ORAL_TABLET | Freq: Two times a day (BID) | ORAL | 0 refills | Status: AC

## 2022-06-19 MED ORDER — PREDNISONE 20 MG PO TABS: 40.0000 mg | ORAL_TABLET | Freq: Every day | ORAL | Status: DC

## 2022-06-19 MED ORDER — PREDNISONE 10 MG (21) PO TBPK: 20.0000 mg | ORAL_TABLET | Freq: Every evening | ORAL | Status: DC

## 2022-06-19 MED ORDER — PREDNISONE 10 MG (21) PO TBPK: 10.0000 mg | ORAL_TABLET | Freq: Three times a day (TID) | ORAL | Status: DC

## 2022-06-19 NOTE — Plan of Care (Signed)

## 2022-06-19 NOTE — Final Consult Note (Signed)
Natasha Bridges, Natasha Bridges [id number] November 28, 1980 Dimple Nanas, MD  Reason for Consult: Tonsillitis with retropharyngeal effusion  HPI: 41 year old female with a 5 day history of progressive worsening sore throat.  She reports she has been working in [employer] environment with mold exposure when on Saturday her throat began to hurt mildly.  This progressively worsened throughout the week to the point that on Wed she presented to Urgent Care.  Strep test was negative and this was thought to be viral and send home for conservative management.  The next day her swallowing became difficult and he could not tolerate PO and presented to ER where she was strep positive.  She was admitted for IV abx/steroids due to inability to tolerate PO.  Denies breathing issues.  Denies prior tonsil issues but does report previous history of strep throat.  Allergies:  Allergies  Allergen Reactions   Flagyl [Metronidazole]     ROS: Review of systems normal other than 12 systems except per HPI.  PMH:  Past Medical History:  Diagnosis Date   Medical history non-contributory     FH:  Family History  Problem Relation Age of Onset   Hypertension Mother        kidney transplant.   Hypertension Father     SH:  Social History   Socioeconomic History   Marital status: Married    Spouse name: Not on file   Number of children: Not on file   Years of education: Not on file   Highest education level: Not on file  Occupational History   Not on file  Tobacco Use   Smoking status: Never   Smokeless tobacco: Never  Substance and Sexual Activity   Alcohol use: No   Drug use: No   Sexual activity: Yes    Partners: Male    Birth control/protection: I.U.D.    Comment: mirena  Other Topics Concern   Not on file  Social History Narrative   Not on file   Social Determinants of Health   Financial Resource Strain: Not on file  Food Insecurity: No Food Insecurity (06/19/2022)   Hunger Vital Sign    Worried About  Running Out of Food in the Last Year: Never true    Ran Out of Food in the Last Year: Never true  Transportation Needs: No Transportation Needs (06/19/2022)   PRAPARE - Administrator, Civil Service (Medical): No    Lack of Transportation (Non-Medical): No  Physical Activity: Not on file  Stress: Not on file  Social Connections: Not on file  Intimate Partner Violence: Not At Risk (06/19/2022)   Humiliation, Afraid, Rape, and Kick questionnaire    Fear of Current or Ex-Partner: No    Emotionally Abused: No    Physically Abused: No    Sexually Abused: No    PSH:  Past Surgical History:  Procedure Laterality Date   NO PAST SURGERIES     WISDOM TOOTH EXTRACTION      Physical  Exam:  GEN-  NAD NEURO-  CN 2-12 grossly intact and symmetric. EARS- external ears clear NOSE- clear anteriorly OC/OP- edematous and erythematous tonsils L > R, no evidence of peritonsillar abscess, mildly elongated uvula NECK- reactive LAD bilaterally RESP- unlabored CARD- regular  CT- tonsillar edema and retropharyngeal effusion    A/P: Acute strep tonsillitis  Plan:  Significant improvement overnight and patient wants to eat breakfast.  Recommend switch to Augmentin or similar antibiotic and discharge home on Sterapred DS 6 day  taper.  Follow up next week.   Keelie Zemanek 06/19/2022 8:25 AM

## 2022-06-19 NOTE — Assessment & Plan Note (Signed)
Per ENT recommendation pt to start iv abx and steroid treatment. We will start unasyn.  Follow cultures.

## 2022-06-19 NOTE — Assessment & Plan Note (Signed)
replace and follow level.   

## 2022-06-19 NOTE — Discharge Summary (Signed)
Physician Discharge Summary  Natasha Bridges JIR:[id number] DOB: May 12, 1981 DOA: 06/18/2022  PCP: Henreitta Leber, PA-C  Admit date: 06/18/2022 Discharge date: 06/19/2022  Admitted From: Home Disposition:  Home  Recommendations for Outpatient Follow-up:  Follow up with PCP in 1-2 weeks Please obtain BMP/CBC in one week your next doctors visit.  Outpatient follow-up with ENT 7 days of oral Augmentin.  Steroid taper Advised soft diet  Home Health: None Equipment/Devices: None Discharge Condition: Stable CODE STATUS: Full code Diet recommendation: Soft diet  Brief/Interim Summary: 41 year old with no significant past medical history comes to the hospital with 5 days of progressive sore throat.  She does have history of mold exposure when the symptoms started and went to an urgent care.  Strep test was negative initially but positive during this admission.  Upon admission she was diagnosed with acute streptococcal tonsillitis.  CT neck showed tonsillar edema with retropharyngeal effusion. she was started on IV antibiotics and steroids.  ENT consulted who recommended to switch her over to Augmentin for total of 7 days and prednisone taper.  She was also advised to remain on soft diet.       Body mass index is 29.52 kg/m.       Discharge Diagnoses:  Principal Problem:   Streptococcal tonsillopharyngitis Active Problems:   Sepsis (HCC)   Hypokalemia      Consultations: ENT  Subjective: Doing well no complaints.  Swelling has significantly improved.  Discharge Exam: Vitals:   06/19/22 0603 06/19/22 0834  BP: 111/73 112/70  Pulse: 89 80  Resp: 18 20  Temp: 98 F (36.7 C) 97.9 F (36.6 C)  SpO2: 98% 96%   Vitals:   06/19/22 0100 06/19/22 0131 06/19/22 0603 06/19/22 0834  BP: 122/68 118/73 111/73 112/70  Pulse: 77 80 89 80  Resp: 18 18 18 20   Temp: 98.5 F (36.9 C) 98.2 F (36.8 C) 98 F (36.7 C) 97.9 F (36.6 C)  TempSrc: Oral Oral Oral Oral  SpO2: 99% 100%  98% 96%  Weight:    78 kg  Height:    5\' 4"  (1.626 m)    General: Pt is alert, awake, not in acute distress Cardiovascular: RRR, S1/S2 +, no rubs, no gallops Respiratory: CTA bilaterally, no wheezing, no rhonchi Abdominal: Soft, NT, ND, bowel sounds + Extremities: no edema, no cyanosis Swollen and erythematous tonsils bilaterally  Discharge Instructions   Allergies as of 06/19/2022       Reactions   Flagyl [metronidazole]         Medication List     TAKE these medications    amoxicillin-clavulanate 875-125 MG tablet Commonly known as: AUGMENTIN Take 1 tablet by mouth every 12 (twelve) hours for 7 days.   levonorgestrel 20 MCG/24HR IUD Commonly known as: MIRENA 1 each by Intrauterine route once.   predniSONE 10 MG tablet Commonly known as: DELTASONE Take 5 tablets (50 mg total) by mouth daily with breakfast for 1 day, THEN 4 tablets (40 mg total) daily with breakfast for 1 day, THEN 3 tablets (30 mg total) daily with breakfast for 1 day, THEN 2 tablets (20 mg total) daily with breakfast for 1 day, THEN 1 tablet (10 mg total) daily with breakfast for 1 day. Start taking on: June 19, 2022        Follow-up Information     06/21/2022, PA-C Follow up in 1 week(s).   Specialty: Obstetrics and Gynecology Why: Have patient call and schedule appointment. Contact information: [address]  [place] [address] [phone]                Allergies  Allergen Reactions   Flagyl [Metronidazole]     You were cared for by a hospitalist during your hospital stay. If you have any questions about your discharge medications or the care you received while you were in the hospital after you are discharged, you can call the unit and asked to speak with the hospitalist on call if the hospitalist that took care of you is not available. Once you are discharged, your primary care physician will handle any further medical issues. Please note that no  refills for any discharge medications will be authorized once you are discharged, as it is imperative that you return to your primary care physician (or establish a relationship with a primary care physician if you do not have one) for your aftercare needs so that they can reassess your need for medications and monitor your lab values.   Procedures/Studies: CT Soft Tissue Neck W Contrast  Result Date: 06/18/2022 CLINICAL DATA:  Sore throat EXAM: CT NECK WITH CONTRAST TECHNIQUE: Multidetector CT imaging of the neck was performed using the standard protocol following the bolus administration of intravenous contrast. RADIATION DOSE REDUCTION: This exam was performed according to the departmental dose-optimization program which includes automated exposure control, adjustment of the mA and/or kV according to patient size and/or use of iterative reconstruction technique. CONTRAST:  63mL OMNIPAQUE IOHEXOL 300 MG/ML  SOLN COMPARISON:  None Available. FINDINGS: Pharynx and larynx: There is left-greater-than-right palatine tonsillar edema without a discrete abscess. There is a retropharyngeal effusion that measures 6 mm. Airway is maintained. Salivary glands: No inflammation, mass, or stone. Thyroid: Normal. Lymph nodes: Bilateral reactive cervical lymphadenopathy. Vascular: Negative. Limited intracranial: Negative. Visualized orbits: Negative. Mastoids and visualized paranasal sinuses: Clear. Skeleton: No acute or aggressive process. Upper chest: Negative. Other: None. IMPRESSION: 1. Acute tonsillopharyngitis with left-greater-than-right palatine tonsillar edema and retropharyngeal effusion. No discrete abscess or drainable fluid collection. 2. Bilateral reactive cervical lymphadenopathy. Electronically Signed   By: Deatra Robinson M.D.   On: 06/18/2022 22:17     The results of significant diagnostics from this hospitalization (including imaging, microbiology, ancillary and laboratory) are listed below for reference.      Microbiology: Recent Results (from the past 240 hour(s))  Group A Strep by PCR     Status: Abnormal   Collection Time: 06/18/22  6:30 PM   Specimen: Throat; Sterile Swab  Result Value Ref Range Status   Group A Strep by PCR DETECTED (A) NOT DETECTED Final    Comment: Performed at [facility], [address] [place], [place] [address]     Labs: BNP (last 3 results) No results for input(s): "BNP" in the last 8760 hours. Basic Metabolic Panel: Recent Labs  Lab 06/18/22 2030 06/19/22 0415  NA 137 137  K 2.7* 3.7  CL 99 104  CO2 26 23  GLUCOSE 118* 143*  BUN 11 10  CREATININE 0.77 0.65  CALCIUM 8.8* 8.5*   Liver Function Tests: Recent Labs  Lab 06/19/22 0415  AST 19  ALT 29  ALKPHOS 69  BILITOT 1.8*  PROT 8.4*  ALBUMIN 3.6   No results for input(s): "LIPASE", "AMYLASE" in the last 168 hours. No results for input(s): "AMMONIA" in the last 168 hours. CBC: Recent Labs  Lab 06/18/22 2030 06/19/22 0415  WBC 21.9* 23.9*  NEUTROABS 17.6*  --   HGB 11.2* 11.1*  HCT 33.5* 33.2*  MCV 85.5  85.6  PLT 343 319   Cardiac Enzymes: No results for input(s): "CKTOTAL", "CKMB", "CKMBINDEX", "TROPONINI" in the last 168 hours. BNP: Invalid input(s): "POCBNP" CBG: No results for input(s): "GLUCAP" in the last 168 hours. D-Dimer No results for input(s): "DDIMER" in the last 72 hours. Hgb A1c No results for input(s): "HGBA1C" in the last 72 hours. Lipid Profile No results for input(s): "CHOL", "HDL", "LDLCALC", "TRIG", "CHOLHDL", "LDLDIRECT" in the last 72 hours. Thyroid function studies No results for input(s): "TSH", "T4TOTAL", "T3FREE", "THYROIDAB" in the last 72 hours.  Invalid input(s): "FREET3" Anemia work up No results for input(s): "VITAMINB12", "FOLATE", "FERRITIN", "TIBC", "IRON", "RETICCTPCT" in the last 72 hours. Urinalysis    Component Value Date/Time   COLORURINE YELLOW 09/28/2007 1120   APPEARANCEUR CLEAR 09/28/2007 1120   LABSPEC 1.015  09/28/2007 1120   PHURINE 8.0 09/28/2007 1120   GLUCOSEU NEGATIVE 09/28/2007 1120   HGBUR NEGATIVE 09/28/2007 1120   BILIRUBINUR NEGATIVE 09/28/2007 1120   KETONESUR NEGATIVE 09/28/2007 1120   PROTEINUR 30 (A) 09/28/2007 1120   UROBILINOGEN 0.2 09/28/2007 1120   NITRITE NEGATIVE 09/28/2007 1120   LEUKOCYTESUR NEGATIVE 09/28/2007 1120   Sepsis Labs Recent Labs  Lab 06/18/22 2030 06/19/22 0415  WBC 21.9* 23.9*   Microbiology Recent Results (from the past 240 hour(s))  Group A Strep by PCR     Status: Abnormal   Collection Time: 06/18/22  6:30 PM   Specimen: Throat; Sterile Swab  Result Value Ref Range Status   Group A Strep by PCR DETECTED (A) NOT DETECTED Final    Comment: Performed at [facility], [address] [place], [place] [address]     Time coordinating discharge:  I have spent 35 minutes face to face with the patient and on the ward discussing the patients care, assessment, plan and disposition with other care givers. >50% of the time was devoted counseling the patient about the risks and benefits of treatment/Discharge disposition and coordinating care.   SIGNED:   Dimple Nanas, MD  [facility] 06/19/2022, 12:15 PM   If 7PM-7AM, please contact night-coverage

## 2022-06-19 NOTE — H&P (Signed)
History and Physical    Chief Complaint: Sore throat.    HISTORY OF PRESENT ILLNESS: Natasha Bridges is an 41 y.o. female seen today for sore throat since Saturday.  Has progressively worse with difficulty swallowing  that started today. Pt did go to urgent care and was found to be neg for strep throat which is positive today.  Pt has no other medical history. Pt takes no medication routinely.    PAST MEDICAL HISTORY: Past Medical History:  Diagnosis Date   Medical history non-contributory     Review Of Systems: Review of Systems  HENT:  Positive for sore throat and trouble swallowing.   All other systems reviewed and are negative.    ALLERGIES: Allergies  Allergen Reactions   Flagyl [Metronidazole]     PAST SURGICAL HISTORY: Past Surgical History:  Procedure Laterality Date   NO PAST SURGERIES     WISDOM TOOTH EXTRACTION       SOCIAL HISTORY: Social History   Socioeconomic History   Marital status: Married    Spouse name: Not on file   Number of children: Not on file   Years of education: Not on file   Highest education level: Not on file  Occupational History   Not on file  Tobacco Use   Smoking status: Never   Smokeless tobacco: Never  Substance and Sexual Activity   Alcohol use: No   Drug use: No   Sexual activity: Yes    Partners: Male    Birth control/protection: I.U.D.    Comment: mirena  Other Topics Concern   Not on file  Social History Narrative   Not on file   Social Determinants of Health   Financial Resource Strain: Not on file  Food Insecurity: Not on file  Transportation Needs: Not on file  Physical Activity: Not on file  Stress: Not on file  Social Connections: Not on file      CURRENT MEDS:  Current Facility-Administered Medications (Endocrine & Metabolic):    dexamethasone (DECADRON) injection 4 mg       Current Facility-Administered Medications (Analgesics):    acetaminophen (TYLENOL) tablet 650 mg **OR**  acetaminophen (TYLENOL) suppository 650 mg   morphine (PF) 2 MG/ML injection 2 mg     Current Facility-Administered Medications (Other):    0.9 %  sodium chloride infusion   Ampicillin-Sulbactam (UNASYN) 3 g in sodium chloride 0.9 % 100 mL IVPB   sodium chloride flush (NS) 0.9 % injection 3 mL  No current outpatient medications on file.     ED Course: >Pt in Ed is alert awake oriented afebrile and received IV antibiotics as patient was found to have tonsillopharyngitis on CT scan. ENT was consulted per EDMD and will follow patient in AM.   Vitals:   06/18/22 2152 06/18/22 2331 06/19/22 0100 06/19/22 0131  BP: 121/71 117/79 122/68 118/73  Pulse: 91 99 77 80  Resp: 18  18 18   Temp: 100.3 F (37.9 C) 99.2 F (37.3 C) 98.5 F (36.9 C) 98.2 F (36.8 C)  TempSrc: Oral Oral Oral   SpO2: 98% 98% 99% 100%  Weight:       > Vitals:   06/18/22 1826 06/18/22 2152 06/18/22 2331 06/19/22 0100  BP: 135/73 121/71 117/79 122/68   06/19/22 0131  BP: 118/73    >No intake/output data recorded. >Total I/O In: 1100 [IV Piggyback:1100] Out: -  >SpO2: 100 % > Blood work shows hypokalemia, leukocytosis with a white count of 21.9, anemia with a hemoglobin of  11.2.  Results for orders placed or performed during the hospital encounter of 06/18/22 (from the past 24 hour(s))  Group A Strep by PCR     Status: Abnormal   Collection Time: 06/18/22  6:30 PM   Specimen: Throat; Sterile Swab  Result Value Ref Range   Group A Strep by PCR DETECTED (A) NOT DETECTED  Basic metabolic panel     Status: Abnormal   Collection Time: 06/18/22  8:30 PM  Result Value Ref Range   Sodium 137 135 - 145 mmol/L   Potassium 2.7 (LL) 3.5 - 5.1 mmol/L   Chloride 99 98 - 111 mmol/L   CO2 26 22 - 32 mmol/L   Glucose, Bld 118 (H) 70 - 99 mg/dL   BUN 11 6 - 20 mg/dL   Creatinine, Ser 9.21 0.44 - 1.00 mg/dL   Calcium 8.8 (L) 8.9 - 10.3 mg/dL   GFR, Estimated >19 >41 mL/min   Anion gap 12 5 - 15  CBC with  Differential     Status: Abnormal   Collection Time: 06/18/22  8:30 PM  Result Value Ref Range   WBC 21.9 (H) 4.0 - 10.5 K/uL   RBC 3.92 3.87 - 5.11 MIL/uL   Hemoglobin 11.2 (L) 12.0 - 15.0 g/dL   HCT 74.0 (L) 81.4 - 48.1 %   MCV 85.5 80.0 - 100.0 fL   MCH 28.6 26.0 - 34.0 pg   MCHC 33.4 30.0 - 36.0 g/dL   RDW 85.6 31.4 - 97.0 %   Platelets 343 150 - 400 K/uL   nRBC 0.0 0.0 - 0.2 %   Neutrophils Relative % 80 %   Neutro Abs 17.6 (H) 1.7 - 7.7 K/uL   Lymphocytes Relative 10 %   Lymphs Abs 2.1 0.7 - 4.0 K/uL   Monocytes Relative 9 %   Monocytes Absolute 2.0 (H) 0.1 - 1.0 K/uL   Eosinophils Relative 0 %   Eosinophils Absolute 0.0 0.0 - 0.5 K/uL   Basophils Relative 0 %   Basophils Absolute 0.1 0.0 - 0.1 K/uL   Immature Granulocytes 1 %   Abs Immature Granulocytes 0.15 (H) 0.00 - 0.07 K/uL  Mononucleosis screen     Status: None   Collection Time: 06/18/22  8:30 PM  Result Value Ref Range   Mono Screen NEGATIVE NEGATIVE  Patient meets sepsis criteria and was given Unasyn, dexamethasone, IV fluids. Meds ordered this encounter  Medications   acetaminophen (TYLENOL) 160 MG/5ML solution 650 mg   DISCONTD: cefTRIAXone (ROCEPHIN) injection 1 g    Order Specific Question:   Antibiotic Indication:    Answer:   Other Indication (list below)    Order Specific Question:   Other Indication:    Answer:   strep tonsillitis   DISCONTD: dexamethasone (DECADRON) 10 MG/ML injection for Pediatric ORAL use 10 mg   DISCONTD: lidocaine (PF) (XYLOCAINE) 1 % injection 5 mL   Ampicillin-Sulbactam (UNASYN) 3 g in sodium chloride 0.9 % 100 mL IVPB    Order Specific Question:   Antibiotic Indication:    Answer:   Other Indication (list below)    Order Specific Question:   Other Indication:    Answer:   strep , tonsillitis   dexamethasone (DECADRON) injection 10 mg   sodium chloride 0.9 % bolus 1,000 mL   iohexol (OMNIPAQUE) 300 MG/ML solution 75 mL   potassium chloride SA (KLOR-CON M) CR tablet 40 mEq    sodium chloride flush (NS) 0.9 % injection 3 mL   0.9 %  sodium chloride infusion   OR Linked Order Group    acetaminophen (TYLENOL) tablet 650 mg    acetaminophen (TYLENOL) suppository 650 mg   morphine (PF) 2 MG/ML injection 2 mg   Ampicillin-Sulbactam (UNASYN) 3 g in sodium chloride 0.9 % 100 mL IVPB    Order Specific Question:   Antibiotic Indication:    Answer:   Other Indication (list below)    Order Specific Question:   Other Indication:    Answer:   strep , tonsillitis   dexamethasone (DECADRON) injection 4 mg    Admission Imaging : CT Soft Tissue Neck W Contrast  Result Date: 06/18/2022 CLINICAL DATA:  Sore throat EXAM: CT NECK WITH CONTRAST TECHNIQUE: Multidetector CT imaging of the neck was performed using the standard protocol following the bolus administration of intravenous contrast. RADIATION DOSE REDUCTION: This exam was performed according to the departmental dose-optimization program which includes automated exposure control, adjustment of the mA and/or kV according to patient size and/or use of iterative reconstruction technique. CONTRAST:  66mL OMNIPAQUE IOHEXOL 300 MG/ML  SOLN COMPARISON:  None Available. FINDINGS: Pharynx and larynx: There is left-greater-than-right palatine tonsillar edema without a discrete abscess. There is a retropharyngeal effusion that measures 6 mm. Airway is maintained. Salivary glands: No inflammation, mass, or stone. Thyroid: Normal. Lymph nodes: Bilateral reactive cervical lymphadenopathy. Vascular: Negative. Limited intracranial: Negative. Visualized orbits: Negative. Mastoids and visualized paranasal sinuses: Clear. Skeleton: No acute or aggressive process. Upper chest: Negative. Other: None. IMPRESSION: 1. Acute tonsillopharyngitis with left-greater-than-right palatine tonsillar edema and retropharyngeal effusion. No discrete abscess or drainable fluid collection. 2. Bilateral reactive cervical lymphadenopathy. Electronically Signed   By: Deatra Robinson M.D.   On: 06/18/2022 22:17    Physical Exam Vitals and nursing note reviewed.  Constitutional:      General: She is not in acute distress.    Appearance: Normal appearance. She is not ill-appearing, toxic-appearing or diaphoretic.  HENT:     Head: Normocephalic and atraumatic.     Right Ear: Hearing and external ear normal.     Left Ear: Hearing and external ear normal.     Nose: Nose normal. No nasal deformity.     Mouth/Throat:     Lips: Pink.     Mouth: Mucous membranes are moist.     Comments: Could not see back of throat.  Eyes:     Extraocular Movements: Extraocular movements intact.  Neck:     Vascular: No carotid bruit.  Cardiovascular:     Rate and Rhythm: Normal rate and regular rhythm.     Pulses: Normal pulses.     Heart sounds: Normal heart sounds.  Pulmonary:     Effort: Pulmonary effort is normal.     Breath sounds: Normal breath sounds.  Abdominal:     General: Bowel sounds are normal. There is no distension.     Palpations: Abdomen is soft. There is no mass.     Tenderness: There is no abdominal tenderness. There is no guarding.     Hernia: No hernia is present.  Musculoskeletal:     Right lower leg: No edema.     Left lower leg: No edema.  Skin:    General: Skin is warm.  Neurological:     General: No focal deficit present.     Mental Status: She is alert and oriented to person, place, and time.     Cranial Nerves: Cranial nerves 2-12 are intact.     Motor: Motor function is intact.  Psychiatric:        Attention and Perception: Attention normal.        Mood and Affect: Mood normal.        Speech: Speech normal.        Behavior: Behavior normal. Behavior is cooperative.        Cognition and Memory: Cognition normal.     Assessment and Plan: * Streptococcal tonsillopharyngitis Per ENT recommendation pt to start iv abx and steroid treatment. We will start unasyn.  Follow cultures.    Hypokalemia replace and follow level.   Sepsis  (HCC) CBC    Component Value Date/Time   WBC 21.9 (H) 06/18/2022 2030   RBC 3.92 06/18/2022 2030   HGB 11.2 (L) 06/18/2022 2030   HCT 33.5 (L) 06/18/2022 2030   PLT 343 06/18/2022 2030   MCV 85.5 06/18/2022 2030   MCH 28.6 06/18/2022 2030   MCHC 33.4 06/18/2022 2030   RDW 12.7 06/18/2022 2030   LYMPHSABS 2.1 06/18/2022 2030   MONOABS 2.0 (H) 06/18/2022 2030   EOSABS 0.0 06/18/2022 2030   BASOSABS 0.1 06/18/2022 2030   Cont with iv abx.    Unresulted Labs (From admission, onward)     Start     Ordered   06/19/22 0500  Comprehensive metabolic panel  Tomorrow morning,   STAT        06/18/22 2331   06/19/22 0500  CBC  Tomorrow morning,   STAT        06/18/22 2331   06/18/22 2329  HIV Antibody (routine testing w rflx)  (HIV Antibody (Routine testing w reflex) panel)  Once,   URGENT        06/18/22 2331             DVT prophylaxis:  Heparin    Code Status:  Full code    Family Communication:  Kyrah, Schiro (Spouse)  [phone] (Home Phone)   Disposition Plan:  Home    Consults called:  ENT: Dr. Andee Poles.   Admission status: Inpatient    Unit/ Expected LOS: Med surg /2 days.    Gertha Calkin MD [facility]  6 PM- 2 AM. Please contact me via secure Chat 6 PM-2 AM. [phone] ( Pager ) To contact the [facility] Attending or Consulting provider 7A - 7P or covering provider during after hours 7P -7A, for this patient.   Check the care team in [facility] and look for a) attending/consulting [facility] provider listed and b) the [facility] team listed Log into [email or web address] and use [facility] universal password to access. If you do not have the password, please contact the hospital operator. Locate the [facility] provider you are looking for under [facility] and page to a number that you can be directly reached. If you still have difficulty reaching the provider, please page the [facility] (Director on Call) for the Hospitalists listed on amion for  assistance. [email or web address] 06/19/2022, 1:33 AM

## 2022-06-19 NOTE — TOC Progression Note (Signed)
Transition of Care [facility]) - Progression Note    Patient Details  Name: Natasha Bridges MRN: [id number] Date of Birth: 25-Mar-1981  Transition of Care [facility]) CM/SW Contact  Marlowe Sax, RN Phone Number: 06/19/2022, 10:29 AM  Clinical Narrative:     Transition of Care (TOC) Screening Note   Patient Details  Name: Natasha Bridges Date of Birth: 09/12/1981   Transition of Care [facility]) CM/SW Contact:    Marlowe Sax, RN Phone Number: 06/19/2022, 10:29 AM    Transition of Care Department [facility]) has reviewed patient and no TOC needs have been identified at this time. We will continue to monitor patient advancement through interdisciplinary progression rounds. If new patient transition needs arise, please place a TOC consult.          Expected Discharge Plan and Services           Expected Discharge Date: 06/19/22                                     Social Determinants of Health (SDOH) Interventions    Readmission Risk Interventions     No data to display

## 2022-06-19 NOTE — Progress Notes (Signed)
Charge Nurse reviewed discharge instructions. Pt discharged with all personal belongings.  Staff wheeled pt out. Pt transported self to home.

## 2022-06-19 NOTE — Assessment & Plan Note (Signed)
CBC    Component Value Date/Time   WBC 21.9 (H) 06/18/2022 2030   RBC 3.92 06/18/2022 2030   HGB 11.2 (L) 06/18/2022 2030   HCT 33.5 (L) 06/18/2022 2030   PLT 343 06/18/2022 2030   MCV 85.5 06/18/2022 2030   MCH 28.6 06/18/2022 2030   MCHC 33.4 06/18/2022 2030   RDW 12.7 06/18/2022 2030   LYMPHSABS 2.1 06/18/2022 2030   MONOABS 2.0 (H) 06/18/2022 2030   EOSABS 0.0 06/18/2022 2030   BASOSABS 0.1 06/18/2022 2030   Cont with iv abx.

## 2022-09-26 ENCOUNTER — Other Ambulatory Visit: Payer: Self-pay

## 2022-09-26 DIAGNOSIS — Z1152 Encounter for screening for COVID-19: Secondary | ICD-10-CM | POA: Insufficient documentation

## 2022-09-26 DIAGNOSIS — J020 Streptococcal pharyngitis: Secondary | ICD-10-CM | POA: Insufficient documentation

## 2022-09-26 DIAGNOSIS — R070 Pain in throat: Secondary | ICD-10-CM | POA: Diagnosis present

## 2022-09-26 LAB — RESP PANEL BY RT-PCR (RSV, FLU A&B, COVID)  RVPGX2
Influenza A by PCR: NEGATIVE
Influenza B by PCR: NEGATIVE
Resp Syncytial Virus by PCR: NEGATIVE
SARS Coronavirus 2 by RT PCR: NEGATIVE

## 2022-09-26 LAB — GROUP A STREP BY PCR: Group A Strep by PCR: DETECTED — AB

## 2022-09-26 MED ORDER — ACETAMINOPHEN 325 MG PO TABS
650.0000 mg | ORAL_TABLET | Freq: Once | ORAL | Status: AC
  Administered 2022-09-26: 650 mg via ORAL
  Filled 2022-09-26: qty 2

## 2022-09-26 NOTE — ED Triage Notes (Signed)
Pt here from home with c/o sore throat and congestion and fever

## 2022-09-27 ENCOUNTER — Emergency Department (HOSPITAL_BASED_OUTPATIENT_CLINIC_OR_DEPARTMENT_OTHER)
Admission: EM | Admit: 2022-09-27 | Discharge: 2022-09-27 | Disposition: A | Discharge status: Home / Self Care | Payer: Managed Care, Other (non HMO) | Attending: Emergency Medicine | Admitting: Emergency Medicine

## 2022-09-27 DIAGNOSIS — J020 Streptococcal pharyngitis: Secondary | ICD-10-CM

## 2022-09-27 MED ORDER — AMOXICILLIN 500 MG PO CAPS
1000.0000 mg | ORAL_CAPSULE | Freq: Once | ORAL | Status: AC
  Administered 2022-09-27: 1000 mg via ORAL
  Filled 2022-09-27: qty 2

## 2022-09-27 MED ORDER — DEXAMETHASONE 4 MG PO TABS
10.0000 mg | ORAL_TABLET | Freq: Once | ORAL | Status: AC
  Administered 2022-09-27: 10 mg via ORAL
  Filled 2022-09-27: qty 3

## 2022-09-27 MED ORDER — AMOXICILLIN 500 MG PO CAPS: 1000.0000 mg | ORAL_CAPSULE | Freq: Two times a day (BID) | ORAL | 0 refills | Status: AC

## 2022-09-27 NOTE — Discharge Instructions (Addendum)
Drink plenty of fluids. Take ibuprofen and/or acetaminophen as needed for fever or aching. °

## 2022-09-27 NOTE — ED Provider Notes (Signed)
Drink plenty of [facility] EMERGENCY DEPT Provider Note   CSN: [id number] Arrival date & time: 09/26/22  2153     History  Chief complaint: Sore throat  Natasha Bridges is a 41 y.o. female.  The history is provided by the patient.  She complains of a sore throat which started this morning.  She had a mild runny nose for the last 5 days.  She had been hospitalized with a strep infection 3 months ago.  She denies fever, chills, sweats.  There has been minimal cough.  There has been no vomiting or diarrhea.   Home Medications Prior to Admission medications   Medication Sig Start Date End Date Taking? Authorizing Provider  levonorgestrel (MIRENA) 20 MCG/24HR IUD 1 each by Intrauterine route once.    [provider]      Allergies    Flagyl [metronidazole]    Review of Systems   Review of Systems  All other systems reviewed and are negative.   Physical Exam Updated Vital Signs BP 131/78   Pulse (!) 112   Temp (!) 102.7 F (39.3 C)   Resp 18   SpO2 97%  Physical Exam Vitals and nursing note reviewed.   41 year old female, resting comfortably and in no acute distress. Vital signs are significant for elevated temperature. Oxygen saturation is 96%, which is normal. Head is normocephalic and atraumatic. PERRLA, EOMI. Oropharynx is moderately erythematous diffusely without tonsillar hypertrophy or exudate.  There is no pooling of secretion, phonation is normal. Neck is nontender and supple without adenopathy or JVD. Back is nontender and there is no CVA tenderness. Lungs are clear without rales, wheezes, or rhonchi. Chest is nontender. Heart has regular rate and rhythm without murmur. Abdomen is soft, flat, nontender. Extremities have no cyanosis or edema, full range of motion is present. Skin is warm and dry without rash. Neurologic: Mental status is normal, cranial nerves are intact, moves all extremities equally.  ED Results / Procedures / Treatments    Labs (all labs ordered are listed, but only abnormal results are displayed) Labs Reviewed  GROUP A STREP BY PCR - Abnormal; Notable for the following components:      Result Value   Group A Strep by PCR DETECTED (*)    All other components within normal limits  RESP PANEL BY RT-PCR (RSV, FLU A&B, COVID)  [id number]    EKG EKG Interpretation  Date/Time:  Saturday September 26 2022 22:58:18 EST Ventricular Rate:  126 PR Interval:  178 QRS Duration: 76 QT Interval:  288 QTC Calculation: 417 R Axis:   74 Text Interpretation: Sinus tachycardia Right atrial enlargement Borderline ECG No previous ECGs available Confirmed by Dione Booze ([id number]) on 09/26/2022 11:05:16 PM  Radiology No results found.  Procedures Procedures    Medications Ordered in ED Medications  dexamethasone (DECADRON) tablet 10 mg (has no administration in time range)  amoxicillin (AMOXIL) capsule 1,000 mg (has no administration in time range)  acetaminophen (TYLENOL) tablet 650 mg (650 mg Oral Given 09/26/22 2305)    ED Course/ Medical Decision Making/ A&P                           Medical Decision Making Risk OTC drugs.   Fever with sore throat and history of streptococcal infection concerning for recurrent streptococcal infection.  Consider viral pharyngitis.  Other viral etiologies to consider include influenza, COVID-19, RSV.  I have reviewed and interpreted her laboratory tests, and  my interpretation is positive PCR for group A strep, negative PCR for respiratory pathogen's.  She has streptococcal pharyngitis.  I reviewed and interpreted her electrocardiogram, and my interpretation is sinus tachycardia but otherwise normal.  I offered patient the option of single dose parenteral penicillin G versus 10-day course of oral penicillin and she has opted for the oral penicillin.  I have ordered an initial dose of amoxicillin and I am giving her a prescription for a 10-day course of amoxicillin.  I have also  ordered a single dose of dexamethasone.  I have reviewed her past records, and she had been admitted on 06/18/2022 with streptococcal pharyngitis but concern for retropharyngeal abscess.  No such concerns are present today.  Final Clinical Impression(s) / ED Diagnoses Final diagnoses:  Strep pharyngitis    Rx / DC Orders ED Discharge Orders          Ordered    amoxicillin (AMOXIL) 500 MG capsule  2 times daily        09/27/22 [id number]              Delora Fuel, MD [id number] [phone]

## 2023-03-12 ENCOUNTER — Other Ambulatory Visit: Payer: Self-pay | Admitting: Student

## 2023-03-12 DIAGNOSIS — S46012A Strain of muscle(s) and tendon(s) of the rotator cuff of left shoulder, initial encounter: Secondary | ICD-10-CM

## 2023-03-16 ENCOUNTER — Ambulatory Visit
Admission: RE | Admit: 2023-03-16 | Discharge: 2023-03-16 | Disposition: A | Discharge status: Home / Self Care | Payer: Managed Care, Other (non HMO) | Source: Ambulatory Visit | Attending: Student | Admitting: Student

## 2023-03-16 DIAGNOSIS — S46012A Strain of muscle(s) and tendon(s) of the rotator cuff of left shoulder, initial encounter: Secondary | ICD-10-CM

## 2023-08-24 ENCOUNTER — Other Ambulatory Visit: Payer: Self-pay | Admitting: Family Medicine

## 2023-08-24 ENCOUNTER — Ambulatory Visit
Admission: RE | Admit: 2023-08-24 | Discharge: 2023-08-24 | Disposition: A | Discharge status: Home / Self Care | Payer: Managed Care, Other (non HMO) | Source: Ambulatory Visit | Attending: Family Medicine | Admitting: Family Medicine

## 2023-08-24 ENCOUNTER — Encounter (HOSPITAL_COMMUNITY): Payer: Self-pay

## 2023-08-24 ENCOUNTER — Ambulatory Visit (HOSPITAL_COMMUNITY): Payer: Managed Care, Other (non HMO)

## 2023-08-24 DIAGNOSIS — Z77120 Contact with and (suspected) exposure to mold (toxic): Secondary | ICD-10-CM

## 2024-01-07 ENCOUNTER — Other Ambulatory Visit: Payer: Self-pay | Admitting: Obstetrics and Gynecology

## 2024-01-07 DIAGNOSIS — Z1231 Encounter for screening mammogram for malignant neoplasm of breast: Secondary | ICD-10-CM
